# Patient Record
Sex: Female | Born: 1950
Health system: Southern US, Community
[De-identification: ages and names within clinical notes are randomized; demographics above are authoritative.]

## PROBLEM LIST (undated history)

## (undated) DIAGNOSIS — L900 Lichen sclerosus et atrophicus: Secondary | ICD-10-CM

## (undated) DIAGNOSIS — Z8719 Personal history of other diseases of the digestive system: Secondary | ICD-10-CM

## (undated) HISTORY — DX: Lichen sclerosus et atrophicus: L90.0

## (undated) HISTORY — DX: Personal history of other diseases of the digestive system: Z87.19

## (undated) HISTORY — PX: BREAST BIOPSY: SHX20

---

## 1977-10-25 HISTORY — PX: ABDOMINAL HYSTERECTOMY: SHX81

## 2004-08-03 ENCOUNTER — Ambulatory Visit: Payer: Self-pay | Admitting: Family Medicine

## 2007-10-26 HISTORY — PX: OTHER SURGICAL HISTORY: SHX169

## 2008-03-28 ENCOUNTER — Ambulatory Visit: Payer: Self-pay | Admitting: Internal Medicine

## 2008-10-03 ENCOUNTER — Inpatient Hospital Stay: Payer: Self-pay | Admitting: Internal Medicine

## 2010-02-19 ENCOUNTER — Ambulatory Visit: Payer: Self-pay | Admitting: Family Medicine

## 2010-02-23 ENCOUNTER — Emergency Department: Payer: Self-pay | Admitting: Emergency Medicine

## 2010-02-23 DIAGNOSIS — I10 Essential (primary) hypertension: Secondary | ICD-10-CM | POA: Insufficient documentation

## 2012-04-17 LAB — HM HEPATITIS C SCREENING LAB: HM Hepatitis Screen: NEGATIVE

## 2013-08-14 DIAGNOSIS — R9431 Abnormal electrocardiogram [ECG] [EKG]: Secondary | ICD-10-CM | POA: Insufficient documentation

## 2013-08-20 HISTORY — PX: OTHER SURGICAL HISTORY: SHX169

## 2013-08-31 HISTORY — PX: OTHER SURGICAL HISTORY: SHX169

## 2013-09-10 ENCOUNTER — Encounter: Payer: Self-pay | Admitting: General Surgery

## 2013-09-10 ENCOUNTER — Ambulatory Visit (INDEPENDENT_AMBULATORY_CARE_PROVIDER_SITE_OTHER): Payer: BC Managed Care – PPO | Admitting: General Surgery

## 2013-09-10 VITALS — BP 150/80 | HR 78 | Resp 12 | Ht 63.0 in | Wt 158.0 lb

## 2013-09-10 DIAGNOSIS — R920 Mammographic microcalcification found on diagnostic imaging of breast: Secondary | ICD-10-CM

## 2013-09-10 DIAGNOSIS — R921 Mammographic calcification found on diagnostic imaging of breast: Secondary | ICD-10-CM | POA: Insufficient documentation

## 2013-09-10 NOTE — Progress Notes (Signed)
Patient ID: Dana Harper, female   DOB: May 03, 1951, 62 y.o.   MRN: [id number]  Chief Complaint  Patient presents with  . Other    Abnormal mammogram    HPI Dana Harper is a 62 y.o. female who presents for a breast evaluation. The most recent mammogram was done on 08/20/13. Patient does perform regular self breast checks and gets regular mammograms done.  The patient denies any problems with her breasts at this time. She was last seen in  1999 for right breast calcifications. Nothing was done at that time. She has a sister with breast cancer which was diagnosed approximately 2 months ago.  The patient reports that she does take a baby aspirin daily, and she finds that her skin bruises with minimal trauma.  HPI  Past Medical History  Diagnosis Date  . Hypertension   . Hyperlipidemia     History reviewed. No pertinent past surgical history.  Family History  Problem Relation Age of Onset  . Cancer Father     stomach cancer  . Cancer Sister     breast cancer    Social History History  Substance Use Topics  . Smoking status: Never Smoker   . Smokeless tobacco: Never Used  . Alcohol Use: No    Allergies  Allergen Reactions  . Codeine Rash    Current Outpatient Prescriptions  Medication Sig Dispense Refill  . amLODipine-benazepril (LOTREL) 5-10 MG per capsule Take 1 capsule by mouth daily.      Marland Kitchen aspirin 81 MG tablet Take 81 mg by mouth daily.      Marland Kitchen lovastatin (MEVACOR) 20 MG tablet Take 1 tablet by mouth daily.       No current facility-administered medications for this visit.    Review of Systems Review of Systems  Constitutional: Negative.   Respiratory: Negative.   Cardiovascular: Negative.     Blood pressure 150/80, pulse 78, resp. rate 12, height 5\' 3"  (1.6 m), weight 158 lb (71.668 kg).  Physical Exam Physical Exam  Constitutional: She is oriented to person, place, and time. She appears well-developed and well-nourished.  Neck: Neck supple. No  thyromegaly present.  Cardiovascular: Normal rate, regular rhythm and normal heart sounds.   No murmur heard. Pulmonary/Chest: Effort normal and breath sounds normal. Right breast exhibits no inverted nipple, no mass, no nipple discharge, no skin change and no tenderness. Left breast exhibits no inverted nipple, no mass, no nipple discharge, no skin change and no tenderness.  Lymphadenopathy:    She has no cervical adenopathy.    She has no axillary adenopathy.  Neurological: She is alert and oriented to person, place, and time.  Skin: Skin is warm and dry.    Data Reviewed Bilateral mammograms dated August 16, 2013 showed scattered benign appearing calcifications in the right breast unchanged from previous exams. 2 groups of calcifications were appreciated in the upper outer quadrant left breast. BI-RAD-0.  A focal spot compression views dated August 20, 2013 showed a 2 cm grouping of calcifications all of which exhibited amorphous morphology. BI-RAD-4.  There are diffuse calcifications noted in both breasts, but there is a focal area of increased calcifications in the left upper outer quadrant.   Laboratory studies dated August 14, 2013 showed normal renal function with a creatinine of 0.95, normal electrolytes, minimally elevated serum cholesterol.  ECG obtained during that office visit of August 14, 2013 showed some changes. The patient subsequently underwent a stress test which are reported as normal. She is  however scheduled for cardiac catheterization later this week.  Assessment    Abnormal mammogram.  Abnormal ECG.    Plan    Left breast stereotactic biopsy to sample the area in the upper outer quadrant was recommended.   The patient was asked to contact the office if her cardiac catheterization results and being placed on any antiplatelet medications.  The patient was offered to have another physician complete her biopsy next week, but she declined.    Patient has  been scheduled for a left breast stereotactic biopsy at [facility] for 10-01-13 at 2 pm. She will check-in at the [facility] at 1:30 pm. This patient is aware of date, time, and instructions. Patient verbalizes understanding. [facility] films have been requested today and will be delivered to [facility] prior to scheduled biopsy.    Dana Harper 09/10/2013, 9:26 PM

## 2013-09-10 NOTE — Patient Instructions (Addendum)
Patient to be scheduled for a left breast stereotactic breast biopsy.   Stereotactic Breast Biopsy  A stereotactic breast biopsy is a procedure in which mammography is used in the collection of a sample of breast tissue. Mammography is a type of X-ray exam of the breasts that produces an image called a mammogram. The mammogram allows your health care provider to precisely locate the area of the breast from which a tissue sample will be taken. The tissue is then examined under a microscope to see if cancerous cells are present. A breast biopsy is done when:   A lump, abnormality, or mass is seen in the breast on a breast X-ray (mammogram).   Small calcium deposits (calcifications) are seen in the breast.   The shape or appearance of the breasts changes.   The shape or appearance of the nipples changes. You may have unusual or bloody discharge coming from the nipples, or you may have crusting, retraction, or dimpling of the nipples. A breast biopsy can indicate if you need surgery or other treatment.  LET [facility] CARE PROVIDER KNOW ABOUT:  Any allergies you have.  All medicines you are taking, including vitamins, herbs, eye drops, creams, and over-the-counter medicines.  Previous problems you or members of your family have had with the use of anesthetics.  Any blood disorders you have.  Previous surgeries you have had.  Medical conditions you have. RISKS AND COMPLICATIONS Generally, stereotactic breast biopsy is a safe procedure. However, as with any procedure, complications can occur. Possible complications include:  Infection at the needle-insertion site.   Bleeding or bruising after surgery.  The breast may become altered or deformed as a result of the procedure.  The needle may go through the chest wall into the lung area.  BEFORE THE PROCEDURE  Wear a supportive bra to the procedure.  You will be asked to remove jewelry, dentures, eyeglasses, metal objects, or  clothing that might interfere with the X-ray images. You may want to leave some of these objects at home.  Arrange for someone to drive you home after the procedure if desired. PROCEDURE  A stereotactic breast biopsy is done while you are awake. During the procedure, relax as much as possible. Let your health care provider know if you are uncomfortable, anxious, or in pain. Usually, the only discomfort felt during the procedure is caused by staying in one position for the length of the procedure. This discomfort can be reduced by carefully placed cushions. Most of the time the biopsy is done using a table with openings on it. You will be asked to lie facedown on the table and place your breasts through the openings. Your breast is compressed between metal plates to get good X-ray images. Your skin will be cleaned, and a numbing medicine (local anesthetic) will be injected. A small cut (incision) will be made in your breast. The tip of the biopsy needle will be directed through the incision. Several small pieces of suspicious tissue will be taken. Then, a final set of X-ray images will be obtained. If they show that the suspicious tissue has been mostly or completely removed, a small clip will be left at the biopsy site. This is done so that the biopsy site can be easily located if the results of the biopsy show that the tissue is cancerous.  After the procedure, the incision will be stitched (sutured) or taped and covered with a bandage (dressing). Your health care provider may apply a pressure dressing and  an ice pack to prevent bleeding and swelling in the breast.  A stereotactic breast biopsy can take 30 minutes or more. AFTER THE PROCEDURE  If you are doing well and have no problems, you will be allowed to go home.  Document Released: 07/10/2003 Document Revised: 06/13/2013 Document Reviewed: 05/10/2013 [facility] Patient Information 2014 [place], [place].  Patient has been scheduled for a left breast  stereotactic biopsy at [facility] for 10-01-13 at 2 pm. She will check-in at the [facility] at 1:30 pm. This patient is aware of date, time, and instructions. Patient verbalizes understanding.

## 2013-09-12 ENCOUNTER — Ambulatory Visit: Payer: Self-pay | Admitting: Cardiology

## 2013-10-01 ENCOUNTER — Ambulatory Visit: Payer: Self-pay | Admitting: General Surgery

## 2013-10-01 DIAGNOSIS — R920 Mammographic microcalcification found on diagnostic imaging of breast: Secondary | ICD-10-CM

## 2013-10-02 ENCOUNTER — Telehealth: Payer: Self-pay | Admitting: *Deleted

## 2013-10-02 LAB — PATHOLOGY REPORT

## 2013-10-02 NOTE — Telephone Encounter (Signed)
Notified patient as instructed, per Dr. Lemar Livings "negative", patient pleased. Discussed follow-up appointments, patient agrees

## 2013-10-04 ENCOUNTER — Encounter: Payer: Self-pay | Admitting: General Surgery

## 2013-10-08 ENCOUNTER — Ambulatory Visit (INDEPENDENT_AMBULATORY_CARE_PROVIDER_SITE_OTHER): Payer: BC Managed Care – PPO | Admitting: *Deleted

## 2013-10-08 DIAGNOSIS — R928 Other abnormal and inconclusive findings on diagnostic imaging of breast: Secondary | ICD-10-CM

## 2013-10-08 DIAGNOSIS — R921 Mammographic calcification found on diagnostic imaging of breast: Secondary | ICD-10-CM

## 2013-10-08 NOTE — Progress Notes (Signed)
Patient here today for follow up post left breast biopsy. Steristrip in place and aware it may come off in one week.  Minimal bruising noted.  The patient is aware that a heating pad may be used for comfort as needed.  Aware of pathology.

## 2013-12-11 ENCOUNTER — Other Ambulatory Visit: Payer: Self-pay | Admitting: Family Medicine

## 2013-12-11 LAB — CBC WITH DIFFERENTIAL/PLATELET
BASOS ABS: 0.1 10*3/uL (ref 0.0–0.1)
Basophil %: 0.4 %
Eosinophil #: 0 10*3/uL (ref 0.0–0.7)
Eosinophil %: 0 %
HCT: 46.4 % (ref 35.0–47.0)
HGB: 15.8 g/dL (ref 12.0–16.0)
LYMPHS PCT: 1.9 %
Lymphocyte #: 0.4 10*3/uL — ABNORMAL LOW (ref 1.0–3.6)
MCH: 31.3 pg (ref 26.0–34.0)
MCHC: 34.1 g/dL (ref 32.0–36.0)
MCV: 92 fL (ref 80–100)
MONO ABS: 0.6 x10 3/mm (ref 0.2–0.9)
Monocyte %: 3.1 %
NEUTROS ABS: 17.7 10*3/uL — AB (ref 1.4–6.5)
Neutrophil %: 94.6 %
PLATELETS: 202 10*3/uL (ref 150–440)
RBC: 5.04 10*6/uL (ref 3.80–5.20)
RDW: 13.9 % (ref 11.5–14.5)
WBC: 18.7 10*3/uL — ABNORMAL HIGH (ref 3.6–11.0)

## 2013-12-11 LAB — COMPREHENSIVE METABOLIC PANEL
Albumin: 4.2 g/dL (ref 3.4–5.0)
Alkaline Phosphatase: 79 U/L
Anion Gap: 6 — ABNORMAL LOW (ref 7–16)
BUN: 16 mg/dL (ref 7–18)
Bilirubin,Total: 1.8 mg/dL — ABNORMAL HIGH (ref 0.2–1.0)
CALCIUM: 9.3 mg/dL (ref 8.5–10.1)
Chloride: 107 mmol/L (ref 98–107)
Co2: 27 mmol/L (ref 21–32)
Creatinine: 0.9 mg/dL (ref 0.60–1.30)
EGFR (African American): >60
EGFR (Non-African Amer.): >60
GLUCOSE: 126 mg/dL — AB (ref 65–99)
OSMOLALITY: 282 (ref 275–301)
POTASSIUM: 3.9 mmol/L (ref 3.5–5.1)
SGOT(AST): 23 U/L (ref 15–37)
SGPT (ALT): 29 U/L (ref 12–78)
SODIUM: 140 mmol/L (ref 136–145)
TOTAL PROTEIN: 7.8 g/dL (ref 6.4–8.2)

## 2013-12-11 LAB — AMYLASE: AMYLASE: 58 U/L (ref 25–115)

## 2014-05-20 ENCOUNTER — Ambulatory Visit: Payer: Self-pay | Admitting: Family Medicine

## 2014-05-20 LAB — CBC AND DIFFERENTIAL
HCT: 43 % (ref 36–46)
Hemoglobin: 14.3 g/dL (ref 12.0–16.0)
Platelets: 218 10*3/uL (ref 150–399)
WBC: 5.6 10^3/mL

## 2014-08-26 ENCOUNTER — Encounter: Payer: Self-pay | Admitting: General Surgery

## 2014-09-23 ENCOUNTER — Ambulatory Visit: Payer: Self-pay | Admitting: Family Medicine

## 2015-10-01 ENCOUNTER — Other Ambulatory Visit: Payer: Self-pay | Admitting: Family Medicine

## 2015-10-01 NOTE — Telephone Encounter (Signed)
Last ov was 02/10/15. Next ov is 10/30/15.

## 2015-10-01 NOTE — Telephone Encounter (Signed)
Pt contacted office for refill request on the following medications: lovastatin (MEVACOR) 20 MG tablet pt would like enough sent to [facility] in [place] to last until her CPE on 11/09/15. Thanks TNP

## 2015-10-02 MED ORDER — LOVASTATIN 20 MG PO TABS: 20.0000 mg | ORAL_TABLET | Freq: Every day | ORAL | Status: DC

## 2015-10-13 ENCOUNTER — Other Ambulatory Visit: Payer: Self-pay | Admitting: Family Medicine

## 2015-10-29 DIAGNOSIS — R059 Cough, unspecified: Secondary | ICD-10-CM | POA: Insufficient documentation

## 2015-10-29 DIAGNOSIS — L900 Lichen sclerosus et atrophicus: Secondary | ICD-10-CM | POA: Insufficient documentation

## 2015-10-29 DIAGNOSIS — R112 Nausea with vomiting, unspecified: Secondary | ICD-10-CM | POA: Insufficient documentation

## 2015-10-29 DIAGNOSIS — K859 Acute pancreatitis without necrosis or infection, unspecified: Secondary | ICD-10-CM | POA: Insufficient documentation

## 2015-10-29 DIAGNOSIS — M89319 Hypertrophy of bone, unspecified shoulder: Secondary | ICD-10-CM | POA: Insufficient documentation

## 2015-10-29 DIAGNOSIS — R109 Unspecified abdominal pain: Secondary | ICD-10-CM | POA: Insufficient documentation

## 2015-10-29 DIAGNOSIS — R231 Pallor: Secondary | ICD-10-CM | POA: Insufficient documentation

## 2015-10-29 DIAGNOSIS — R05 Cough: Secondary | ICD-10-CM | POA: Insufficient documentation

## 2015-10-29 DIAGNOSIS — Z8619 Personal history of other infectious and parasitic diseases: Secondary | ICD-10-CM | POA: Insufficient documentation

## 2015-10-30 ENCOUNTER — Encounter: Payer: Self-pay | Admitting: Family Medicine

## 2015-10-30 ENCOUNTER — Ambulatory Visit (INDEPENDENT_AMBULATORY_CARE_PROVIDER_SITE_OTHER): Payer: BC Managed Care – PPO | Admitting: Family Medicine

## 2015-10-30 VITALS — BP 150/84 | HR 81 | Temp 98.2°F | Resp 16 | Ht 62.25 in | Wt 167.0 lb

## 2015-10-30 DIAGNOSIS — Z1239 Encounter for other screening for malignant neoplasm of breast: Secondary | ICD-10-CM | POA: Diagnosis not present

## 2015-10-30 DIAGNOSIS — Z1211 Encounter for screening for malignant neoplasm of colon: Secondary | ICD-10-CM

## 2015-10-30 DIAGNOSIS — Z0000 Encounter for general adult medical examination without abnormal findings: Secondary | ICD-10-CM

## 2015-10-30 DIAGNOSIS — Z23 Encounter for immunization: Secondary | ICD-10-CM | POA: Diagnosis not present

## 2015-10-30 DIAGNOSIS — E7800 Pure hypercholesterolemia, unspecified: Secondary | ICD-10-CM | POA: Diagnosis not present

## 2015-10-30 DIAGNOSIS — I10 Essential (primary) hypertension: Secondary | ICD-10-CM

## 2015-10-30 MED ORDER — LOVASTATIN 20 MG PO TABS: 20.0000 mg | ORAL_TABLET | Freq: Every day | ORAL | Status: DC

## 2015-10-30 MED ORDER — AMLODIPINE BESY-BENAZEPRIL HCL 5-10 MG PO CAPS: 1.0000 | ORAL_CAPSULE | Freq: Every day | ORAL | Status: DC

## 2015-10-30 NOTE — Progress Notes (Signed)
Patient: Dana Harper, Female    DOB: [birth year], 65 y.o.   MRN: [id number] Visit Date: 10/30/2015  Today's Provider: Lelon Huh, MD   Chief Complaint  Patient presents with  . Annual Exam  . Hypertension    follow up  . Hyperlipidemia    follow up   Subjective:    Annual physical exam Dana Harper is a 65 y.o. female who presents today for health maintenance and complete physical. She feels fairly well. She reports exercising never. She reports she is sleeping fairly well.  -----------------------------------------------------------------  Hypertension, follow-up:  BP Readings from Last 3 Encounters:  02/10/15 114/80  09/10/13 150/80    She was last seen for hypertension 2 years ago.  BP at that visit was 136/84. Management since that visit includes no changes. She reports good compliance with treatment. She is not having side effects.  She is not exercising. She is adherent to low salt diet.   Outside blood pressures are 130's/80's. She is experiencing none.  Patient denies none.   Cardiovascular risk factors include dyslipidemia and hypertension.  Use of agents associated with hypertension: NSAIDS.     Weight trend: stable Wt Readings from Last 3 Encounters:  02/10/15 162 lb (73.483 kg)  09/10/13 158 lb (71.668 kg)    Current diet: in general, an "unhealthy" diet  ------------------------------------------------------------------------   Lipid/Cholesterol, Follow-up:   Last seen for this2 years ago.  Management changes since that visit include no changes. . Last Lipid Panel: No results found for: CHOL, TRIG, HDL, CHOLHDL, VLDL, LDLCALC, LDLDIRECT  Risk factors for vascular disease include hypertension  She reports good compliance with treatment. She is not having side effects.  Current symptoms include none and have been stable. Weight trend: stable Prior visit with dietician: no Current diet: in general, an "unhealthy"  diet Current exercise: none  Wt Readings from Last 3 Encounters:  02/10/15 162 lb (73.483 kg)  09/10/13 158 lb (71.668 kg)    -------------------------------------------------------------------   Review of Systems  Constitutional: Negative for fever, chills and fatigue.  HENT: Negative for congestion, ear pain, rhinorrhea, sneezing and sore throat.   Eyes: Negative.  Negative for pain and redness.  Respiratory: Negative for cough, shortness of breath and wheezing.   Cardiovascular: Negative for chest pain and leg swelling.  Gastrointestinal: Negative for nausea, abdominal pain, diarrhea, constipation and blood in stool.  Endocrine: Negative for polydipsia and polyphagia.  Genitourinary: Negative.  Negative for dysuria, hematuria, flank pain, vaginal bleeding, vaginal discharge and pelvic pain.  Musculoskeletal: Negative for back pain, joint swelling, arthralgias and gait problem.  Skin: Negative for rash.  Neurological: Negative.  Negative for dizziness, tremors, seizures, weakness, light-headedness, numbness and headaches.  Hematological: Negative for adenopathy. Bruises/bleeds easily.  Psychiatric/Behavioral: Negative.  Negative for behavioral problems, confusion and dysphoric mood. The patient is not nervous/anxious and is not hyperactive.     Social History      She  reports that she has never smoked. She has never used smokeless tobacco. She reports that she does not drink alcohol or use illicit drugs.       Social History   Social History  . Marital Status: Married    Spouse Name: N/A  . Number of Children: 2  . Years of Education: N/A   Occupational History  . Works for [employer]    Social History Main Topics  . Smoking status: Never Smoker   . Smokeless tobacco: Never Used  .  Alcohol Use: No  . Drug Use: No  . Sexual Activity: Not on file   Other Topics Concern  . Not on file   Social History Narrative    Past Medical History   Diagnosis Date  . Hypertension   . Hyperlipidemia   . History of pancreatitis   . Lichen sclerosus      Patient Active Problem List   Diagnosis Date Noted  . Abdominal pain 10/29/2015  . Clavicle enlargement 10/29/2015  . Cough 10/29/2015  . History of shingles 10/29/2015  . Idiopathic or primary livedo reticularis 10/29/2015  . Lichen sclerosus [id number]  . N&V (nausea and vomiting) 10/29/2015  . Pancreatitis 10/29/2015  . Mammographic calcification 09/10/2013  . Abnormal ECG 08/14/2013  . Benign essential HTN 02/23/2010  . Hypercholesteremia 05/31/2008  . Family history of cancer of digestive system 05/29/2008    Past Surgical History  Procedure Laterality Date  . Abdominal hysterectomy  1979    vaginal: partial  hysterectomy. No history of cancerous or precancerous conditions  . Stress echocardiogram  08/31/2013    changes consistent with apical ischemia  . Mammogram screening  08/20/2013    Bi rads cat 4  . Pap smear  2009    Dr. Bayard Males -[facility]    Family History        Family Status  Relation Status Death Age  . Father Deceased 46  . Sister Alive   . Mother Deceased 24    Sepsis        Her family history includes Cancer in her father and sister.    Allergies  Allergen Reactions  . Demerol  [Meperidine]   . Codeine Rash    Previous Medications   AMLODIPINE-BENAZEPRIL (LOTREL) 5-10 MG PER CAPSULE    Take 1 capsule by mouth daily.   ASPIRIN 81 MG TABLET    Take 81 mg by mouth daily.    LOVASTATIN (MEVACOR) 20 MG TABLET    TAKE 1 TABLET BY MOUTH AT BEDTIME    Patient Care Team: Birdie Sons, MD as PCP - General (Family Medicine) Robert Bellow, MD (General Surgery) Birdie Sons, MD as Referring Physician (Family Medicine)     Objective:   Vitals: BP 150/84 mmHg  Pulse 81  Temp(Src) 98.2 F (36.8 C) (Oral)  Resp 16  Ht 5' 2.25" (1.581 m)  Wt 167 lb (75.751 kg)  BMI 30.31 kg/m2  SpO2 98%   Physical Exam  General Appearance:     Alert, cooperative, no distress, appears stated age  Head:    Normocephalic, without obvious abnormality, atraumatic  Eyes:    PERRL, conjunctiva/corneas clear, EOM's intact, fundi    benign, both eyes  Ears:    Normal TM's and external ear canals, both ears  Nose:   Nares normal, septum midline, mucosa normal, no drainage    or sinus tenderness  Throat:   Lips, mucosa, and tongue normal; teeth and gums normal  Neck:   Supple, symmetrical, trachea midline, no adenopathy;    thyroid:  no enlargement/tenderness/nodules; no carotid   bruit or JVD  Back:     Symmetric, no curvature, ROM normal, no CVA tenderness  Lungs:     Clear to auscultation bilaterally, respirations unlabored  Chest Wall:    No tenderness or deformity   Heart:    Regular rate and rhythm, S1 and S2 normal, no murmur, rub   or gallop  Breast Exam:    normal appearance, no masses or tenderness  Abdomen:  Soft, non-tender, bowel sounds active all four quadrants,    no masses, no organomegaly  Pelvic:    deferred  Extremities:   Extremities normal, atraumatic, no cyanosis or edema  Pulses:   2+ and symmetric all extremities  Skin:   Skin color, texture, turgor normal, no rashes or lesions  Lymph nodes:   Cervical, supraclavicular, and axillary nodes normal  Neurologic:   CNII-XII intact, normal strength, sensation and reflexes    throughout    Depression Screen PHQ 2/9 Scores 10/30/2015  PHQ - 2 Score 0  PHQ- 9 Score 0      Assessment & Plan:     Routine Health Maintenance and Physical Exam  Exercise Activities and Dietary recommendations Goals    None      Immunization History  Administered Date(s) Administered  . Tdap 05/29/2008  . Zoster 07/10/2014    Health Maintenance  Topic Date Due  . Hepatitis C Screening  07-Apr-1951  . HIV Screening  06/08/1966  . PAP SMEAR  06/08/1972  . COLONOSCOPY  06/08/2001  . INFLUENZA VACCINE  05/26/2015  . MAMMOGRAM  08/17/2015  . TETANUS/TDAP  05/29/2018  .  ZOSTAVAX  Completed      Discussed health benefits of physical activity, and encouraged her to engage in regular exercise appropriate for her age and condition.    --------------------------------------------------------------------  1. Annual physical exam   2. Hypercholesteremia She is tolerating lovastatin well with no adverse effects.   - Lipid panel  3. Benign essential HTN Well controlled.  Continue current medications.   - EKG 12-Lead - Renal function panel  4. Screening for colon cancer  - Ambulatory referral to Gastroenterology  5. Screening for breast cancer  - MM DIGITAL SCREENING BILATERAL; Future  6. Need for influenza vaccination  - Flu Vaccine QUAD 36+ mos IM

## 2015-10-31 ENCOUNTER — Other Ambulatory Visit: Payer: Self-pay

## 2015-10-31 ENCOUNTER — Telehealth: Payer: Self-pay

## 2015-10-31 LAB — LIPID PANEL
CHOL/HDL RATIO: 3.8 ratio (ref 0.0–4.4)
CHOLESTEROL TOTAL: 214 mg/dL — AB (ref 100–199)
HDL: 57 mg/dL (ref >39)
LDL CALC: 135 mg/dL — AB (ref 0–99)
Triglycerides: 111 mg/dL (ref 0–149)
VLDL CHOLESTEROL CAL: 22 mg/dL (ref 5–40)

## 2015-10-31 LAB — RENAL FUNCTION PANEL
ALBUMIN: 4.5 g/dL (ref 3.6–4.8)
BUN / CREAT RATIO: 17 (ref 11–26)
BUN: 16 mg/dL (ref 8–27)
CO2: 21 mmol/L (ref 18–29)
CREATININE: 0.94 mg/dL (ref 0.57–1.00)
Calcium: 9.7 mg/dL (ref 8.7–10.3)
Chloride: 102 mmol/L (ref 96–106)
GFR, EST AFRICAN AMERICAN: 74 mL/min/{1.73_m2} (ref >59)
GFR, EST NON AFRICAN AMERICAN: 64 mL/min/{1.73_m2} (ref >59)
Glucose: 109 mg/dL — ABNORMAL HIGH (ref 65–99)
Phosphorus: 3.1 mg/dL (ref 2.5–4.5)
Potassium: 4.3 mmol/L (ref 3.5–5.2)
Sodium: 140 mmol/L (ref 134–144)

## 2015-10-31 NOTE — Telephone Encounter (Signed)
Gastroenterology Pre-Procedure Review  Request Date: 12/02/15 Requesting Physician: Dr. Caryn Section  PATIENT REVIEW QUESTIONS: The patient responded to the following health history questions as indicated:    1. Are you having any GI issues? no 2. Do you have a personal history of Polyps? No 3. Do you have a family history of Colon Cancer or Polyps? Yes, Sister colon cancer 4. Diabetes Mellitus? no 5. Joint replacements in the past 12 months?no 6. Major health problems in the past 3 months?no 7. Any artificial heart valves, MVP, or defibrillator?no    MEDICATIONS & ALLERGIES:    Patient reports the following regarding taking any anticoagulation/antiplatelet therapy:   Plavix, Coumadin, Eliquis, Xarelto, Lovenox, Pradaxa, Brilinta, or Effient? no Aspirin? yes (ASA 81mg )  Patient confirms/reports the following medications:  Current Outpatient Prescriptions  Medication Sig Dispense Refill  . amLODipine-benazepril (LOTREL) 5-10 MG capsule Take 1 capsule by mouth daily. 90 capsule 4  . aspirin 81 MG tablet Take 81 mg by mouth daily.     Marland Kitchen lovastatin (MEVACOR) 20 MG tablet Take 1 tablet (20 mg total) by mouth at bedtime. 30 tablet 12   No current facility-administered medications for this visit.    Patient confirms/reports the following allergies:  Allergies  Allergen Reactions  . Demerol  [Meperidine]   . Codeine Rash    No orders of the defined types were placed in this encounter.    AUTHORIZATION INFORMATION Primary Insurance: 1D#: Group #:  Secondary Insurance: 1D#: Group #:  SCHEDULE INFORMATION: Date: 12/02/15 Time: Location: [place]

## 2015-11-06 LAB — HM MAMMOGRAPHY: HM Mammogram: NORMAL

## 2015-11-07 ENCOUNTER — Encounter: Payer: Self-pay | Admitting: *Deleted

## 2015-12-01 ENCOUNTER — Encounter: Payer: Self-pay | Admitting: *Deleted

## 2015-12-02 ENCOUNTER — Ambulatory Visit
Admission: RE | Admit: 2015-12-02 | Discharge: 2015-12-02 | Disposition: A | Discharge status: Home / Self Care | Payer: BC Managed Care – PPO | Source: Ambulatory Visit | Attending: Gastroenterology | Admitting: Gastroenterology

## 2015-12-02 ENCOUNTER — Ambulatory Visit: Payer: BC Managed Care – PPO | Admitting: Certified Registered"

## 2015-12-02 ENCOUNTER — Encounter
Admission: RE | Disposition: A | Discharge status: Home / Self Care | Payer: Self-pay | Source: Ambulatory Visit | Attending: Gastroenterology

## 2015-12-02 ENCOUNTER — Encounter: Payer: Self-pay | Admitting: *Deleted

## 2015-12-02 DIAGNOSIS — L900 Lichen sclerosus et atrophicus: Secondary | ICD-10-CM | POA: Insufficient documentation

## 2015-12-02 DIAGNOSIS — Z90710 Acquired absence of both cervix and uterus: Secondary | ICD-10-CM | POA: Diagnosis not present

## 2015-12-02 DIAGNOSIS — I10 Essential (primary) hypertension: Secondary | ICD-10-CM | POA: Insufficient documentation

## 2015-12-02 DIAGNOSIS — Z1211 Encounter for screening for malignant neoplasm of colon: Secondary | ICD-10-CM | POA: Diagnosis not present

## 2015-12-02 DIAGNOSIS — Z7982 Long term (current) use of aspirin: Secondary | ICD-10-CM | POA: Diagnosis not present

## 2015-12-02 DIAGNOSIS — D125 Benign neoplasm of sigmoid colon: Secondary | ICD-10-CM | POA: Insufficient documentation

## 2015-12-02 DIAGNOSIS — Z8719 Personal history of other diseases of the digestive system: Secondary | ICD-10-CM | POA: Insufficient documentation

## 2015-12-02 DIAGNOSIS — Z885 Allergy status to narcotic agent status: Secondary | ICD-10-CM | POA: Insufficient documentation

## 2015-12-02 DIAGNOSIS — Z79899 Other long term (current) drug therapy: Secondary | ICD-10-CM | POA: Insufficient documentation

## 2015-12-02 DIAGNOSIS — K641 Second degree hemorrhoids: Secondary | ICD-10-CM | POA: Diagnosis not present

## 2015-12-02 DIAGNOSIS — Z86010 Personal history of colonic polyps: Secondary | ICD-10-CM | POA: Insufficient documentation

## 2015-12-02 DIAGNOSIS — Z803 Family history of malignant neoplasm of breast: Secondary | ICD-10-CM | POA: Diagnosis not present

## 2015-12-02 DIAGNOSIS — Z860101 Personal history of adenomatous and serrated colon polyps: Secondary | ICD-10-CM | POA: Insufficient documentation

## 2015-12-02 DIAGNOSIS — Z800 Family history of malignant neoplasm of digestive organs: Secondary | ICD-10-CM | POA: Insufficient documentation

## 2015-12-02 DIAGNOSIS — K5730 Diverticulosis of large intestine without perforation or abscess without bleeding: Secondary | ICD-10-CM | POA: Insufficient documentation

## 2015-12-02 HISTORY — PX: COLONOSCOPY WITH PROPOFOL: SHX5780

## 2015-12-02 SURGERY — COLONOSCOPY WITH PROPOFOL
Anesthesia: General

## 2015-12-02 MED ORDER — PROPOFOL 10 MG/ML IV BOLUS
INTRAVENOUS | Status: DC | PRN
  Administered 2015-12-02: 70 mg via INTRAVENOUS
  Administered 2015-12-02: 30 mg via INTRAVENOUS

## 2015-12-02 MED ORDER — SODIUM CHLORIDE 0.9 % IV SOLN
INTRAVENOUS | Status: DC
  Administered 2015-12-02: 08:00:00 via INTRAVENOUS

## 2015-12-02 MED ORDER — SODIUM CHLORIDE 0.9 % IV SOLN
INTRAVENOUS | Status: DC
  Administered 2015-12-02: 07:00:00 via INTRAVENOUS

## 2015-12-02 MED ORDER — PROPOFOL 500 MG/50ML IV EMUL
INTRAVENOUS | Status: DC | PRN
  Administered 2015-12-02: 120 ug/kg/min via INTRAVENOUS

## 2015-12-02 MED ORDER — LIDOCAINE HCL (CARDIAC) 20 MG/ML IV SOLN
INTRAVENOUS | Status: DC | PRN
  Administered 2015-12-02: 50 mg via INTRAVENOUS

## 2015-12-02 NOTE — Op Note (Signed)
[facility]astroenterology Patient Name: Dana Harper Procedure Date: 12/02/2015 7:45 AM MRN: [id number] Account #: [id number] Date of Birth: 01/17/1951 Admit Type: Outpatient Age: 65 Room: [facility] ENDO ROOM 4 Gender: Female Note Status: Finalized Procedure:         Colonoscopy Indications:       Screening for colorectal malignant neoplasm Providers:         Lucilla Lame, MD Referring MD:      Kirstie Peri. Caryn Section, MD (Referring MD) Medicines:         Propofol per Anesthesia Complications:     No immediate complications. Procedure:         Pre-Anesthesia Assessment:                    - Prior to the procedure, a History and Physical was                     performed, and patient medications and allergies were                     reviewed. The patient's tolerance of previous anesthesia                     was also reviewed. The risks and benefits of the procedure                     and the sedation options and risks were discussed with the                     patient. All questions were answered, and informed consent                     was obtained. Prior Anticoagulants: The patient has taken                     no previous anticoagulant or antiplatelet agents. ASA                     Grade Assessment: II - A patient with mild systemic                     disease. After reviewing the risks and benefits, the                     patient was deemed in satisfactory condition to undergo                     the procedure.                    After obtaining informed consent, the colonoscope was                     passed under direct vision. Throughout the procedure, the                     patient's blood pressure, pulse, and oxygen saturations                     were monitored continuously. The Colonoscope was                     introduced through the anus and advanced to the the cecum,  identified by appendiceal orifice and ileocecal valve. The                colonoscopy was performed without difficulty. The patient                     tolerated the procedure well. The quality of the bowel                     preparation was excellent. Findings:      The perianal and digital rectal examinations were normal.      A 7 mm polyp was found in the sigmoid colon. The polyp was pedunculated.       The polyp was removed with a cold snare. Resection and retrieval were       complete.      Multiple small-mouthed diverticula were found in the sigmoid colon.      Non-bleeding internal hemorrhoids were found during retroflexion. The       hemorrhoids were Grade II (internal hemorrhoids that prolapse but reduce       spontaneously). Impression:        - One 7 mm polyp in the sigmoid colon. Resected and                     retrieved.                    - Diverticulosis in the sigmoid colon.                    - Non-bleeding internal hemorrhoids. Recommendation:    - Await pathology results.                    - Repeat colonoscopy in 5 years if polyp adenoma and 10                     years if hyperplastic Procedure Code(s): --- Professional ---                    [phone], Colonoscopy, flexible; with removal of tumor(s),                     polyp(s), or other lesion(s) by snare technique Diagnosis Code(s): --- Professional ---                    Z12.11, Encounter for screening for malignant neoplasm of                     colon                    D12.5, Benign neoplasm of sigmoid colon CPT copyright 2014 American Medical Association. All rights reserved. The codes documented in this report are preliminary and upon coder review may  be revised to meet current compliance requirements. Lucilla Lame, MD 12/02/2015 8:02:07 AM This report has been signed electronically. Number of Addenda: 0 Note Initiated On: 12/02/2015 7:45 AM Scope Withdrawal Time: 0 hours 7 minutes 30 seconds  Total Procedure Duration: 0 hours 9 minutes 47 seconds       [facility]

## 2015-12-02 NOTE — H&P (Signed)
  [place]  [address], [place], [place] [address] Phone: [phone] Fax : [phone]  Primary Care Physician:  Lelon Huh, MD Primary Gastroenterologist:  Dr. Allen Norris  Pre-Procedure History & Physical: HPI:  Dana Harper is a 65 y.o. female is here for a screening colonoscopy.   Past Medical History  Diagnosis Date  . History of pancreatitis   . Lichen sclerosus   . Hypertension     Past Surgical History  Procedure Laterality Date  . Abdominal hysterectomy  1979    vaginal: partial  hysterectomy. No history of cancerous or precancerous conditions  . Stress echocardiogram  08/31/2013    changes consistent with apical ischemia  . Mammogram screening  08/20/2013    Bi rads cat 4  . Pap smear  2009    Dr. Bayard Males [facility]    Prior to Admission medications   Medication Sig Start Date End Date Taking? Authorizing Provider  aspirin 81 MG tablet Take 81 mg by mouth daily.    Yes Historical Provider, MD  lovastatin (MEVACOR) 20 MG tablet Take 1 tablet (20 mg total) by mouth at bedtime. 10/30/15  Yes Birdie Sons, MD  amLODipine-benazepril (LOTREL) 5-10 MG capsule Take 1 capsule by mouth daily. 10/30/15   Birdie Sons, MD    Allergies as of 10/31/2015 - Review Complete 10/30/2015  Allergen Reaction Noted  . Demerol  [meperidine]  10/29/2015  . Codeine Rash 09/10/2013    Family History  Problem Relation Age of Onset  . Cancer Father     stomach cancer  . Cancer Sister     breast cancer  . Colon cancer Sister     Social History   Social History  . Marital Status: Married    Spouse Name: N/A  . Number of Children: 2  . Years of Education: N/A   Occupational History  . Retired    Social History Main Topics  . Smoking status: Never Smoker   . Smokeless tobacco: Never Used  . Alcohol Use: No  . Drug Use: No  . Sexual Activity: Not on file   Other Topics Concern  . Not on file   Social History Narrative    Review of  Systems: See HPI, otherwise negative ROS  Physical Exam: BP 156/82 mmHg  Pulse 102  Temp(Src) 97.6 F (36.4 C) (Tympanic)  Resp 18  Ht 5\' 2"  (1.575 m)  Wt 167 lb (75.751 kg)  BMI 30.54 kg/m2  SpO2 100% General:   Alert,  pleasant and cooperative in NAD Head:  Normocephalic and atraumatic. Neck:  Supple; no masses or thyromegaly. Lungs:  Clear throughout to auscultation.    Heart:  Regular rate and rhythm. Abdomen:  Soft, nontender and nondistended. Normal bowel sounds, without guarding, and without rebound.   Neurologic:  Alert and  oriented x4;  grossly normal neurologically.  Impression/Plan: [place] is now here to undergo a screening colonoscopy.  Risks, benefits, and alternatives regarding colonoscopy have been reviewed with the patient.  Questions have been answered.  All parties agreeable.

## 2015-12-02 NOTE — Anesthesia Preprocedure Evaluation (Signed)
Anesthesia Evaluation  Patient identified by MRN, date of birth, ID band Patient awake    Reviewed: Allergy & Precautions, NPO status , Patient's Chart, lab work & pertinent test results  Airway Mallampati: II       Dental  (+) Teeth Intact   Pulmonary neg pulmonary ROS,    breath sounds clear to auscultation       Cardiovascular Exercise Tolerance: Good hypertension, Pt. on medications  Rhythm:Regular     Neuro/Psych    GI/Hepatic negative GI ROS, Neg liver ROS,   Endo/Other  negative endocrine ROS  Renal/GU negative Renal ROS     Musculoskeletal negative musculoskeletal ROS (+)   Abdominal Normal abdominal exam  (+)   Peds  Hematology negative hematology ROS (+)   Anesthesia Other Findings   Reproductive/Obstetrics                             Anesthesia Physical Anesthesia Plan  ASA: II  Anesthesia Plan: General   Post-op Pain Management:    Induction: Intravenous  Airway Management Planned: Natural Airway and Nasal Cannula  Additional Equipment:   Intra-op Plan:   Post-operative Plan:   Informed Consent: I have reviewed the patients History and Physical, chart, labs and discussed the procedure including the risks, benefits and alternatives for the proposed anesthesia with the patient or authorized representative who has indicated his/her understanding and acceptance.     Plan Discussed with: CRNA  Anesthesia Plan Comments:         Anesthesia Quick Evaluation

## 2015-12-02 NOTE — Transfer of Care (Signed)
Immediate Anesthesia Transfer of Care Note  Patient: [place] Harper  Procedure(s) Performed: Procedure(s): COLONOSCOPY WITH PROPOFOL (N/A)  Patient Location: Endoscopy Unit  Anesthesia Type:General  Level of Consciousness: awake  Airway & Oxygen Therapy: Patient Spontanous Breathing and Patient connected to nasal cannula oxygen  Post-op Assessment: Report given to RN  Post vital signs: Reviewed  Last Vitals:  Filed Vitals:   12/02/15 0704 12/02/15 0806  BP: 156/82 116/67  Pulse: 102 83  Temp: 36.4 C 36.4 C  Resp: 18 16    Complications: No apparent anesthesia complications

## 2015-12-02 NOTE — Anesthesia Postprocedure Evaluation (Signed)
Anesthesia Post Note  Patient: Dana Harper  Procedure(s) Performed: Procedure(s) (LRB): COLONOSCOPY WITH PROPOFOL (N/A)  Patient location during evaluation: PACU Anesthesia Type: General Level of consciousness: awake and alert Pain management: satisfactory to patient Vital Signs Assessment: post-procedure vital signs reviewed and stable Respiratory status: spontaneous breathing Cardiovascular status: stable Anesthetic complications: no    Last Vitals:  Filed Vitals:   12/02/15 0805 12/02/15 0806  BP:  116/67  Pulse:  83  Temp: 36.4 C 36.4 C  Resp:  16    Last Pain: There were no vitals filed for this visit.               VAN STAVEREN,Kanijah Groseclose

## 2015-12-03 LAB — SURGICAL PATHOLOGY

## 2015-12-04 ENCOUNTER — Encounter: Payer: Self-pay | Admitting: Gastroenterology

## 2016-04-02 IMAGING — CR DG CHEST 2V
1 series · 2 of 2 positions shown · non-contrast
Comparison: 02/23/2010

CLINICAL DATA: Nonproductive cough for 1 week. Crackles on physical
exam.

EXAM:
CHEST  2 VIEW

[Series 1: kdxr chest pa (or ap) and lat · 0.14mm/px · 2 of 2 slices shown]
[im 1/2]
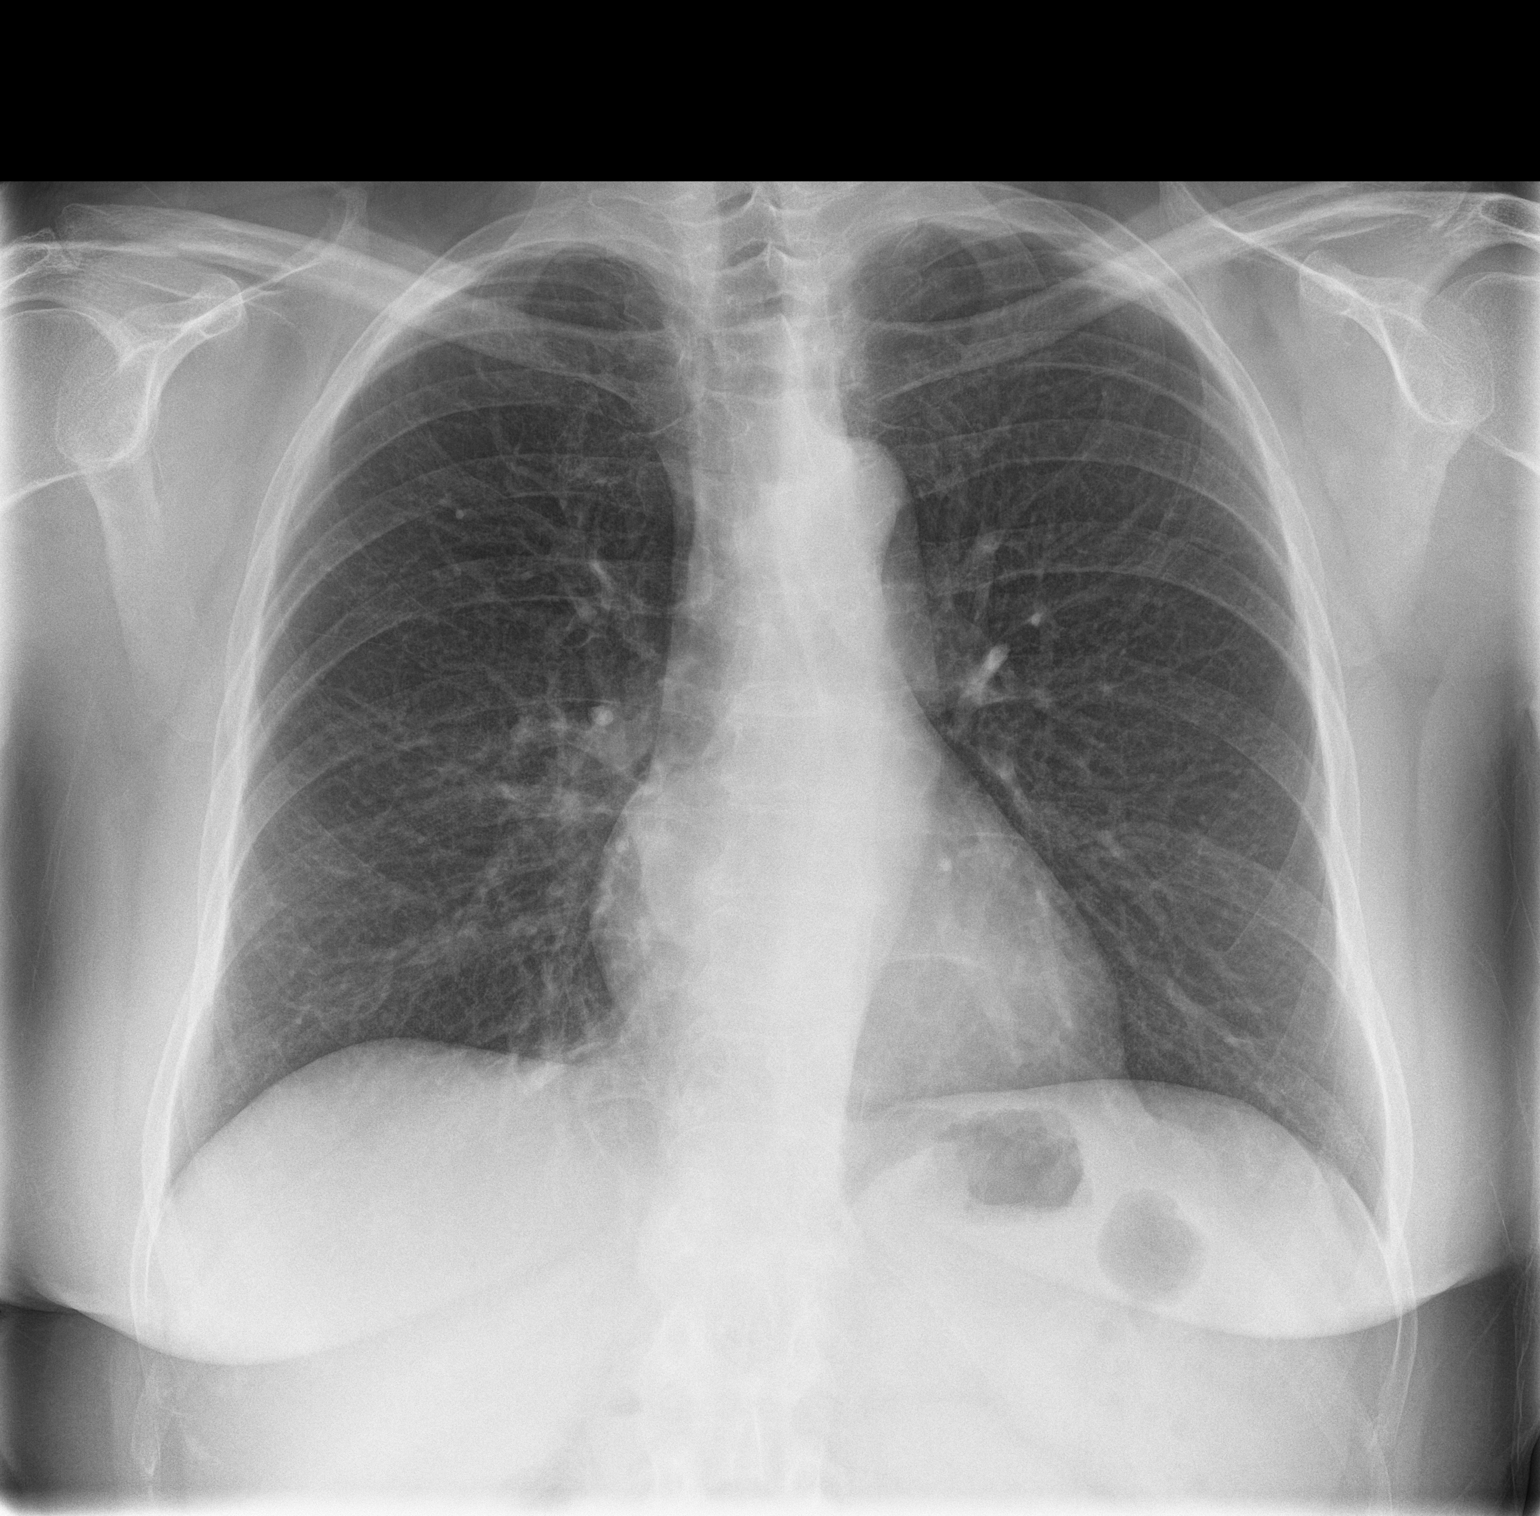
[im 2/2]
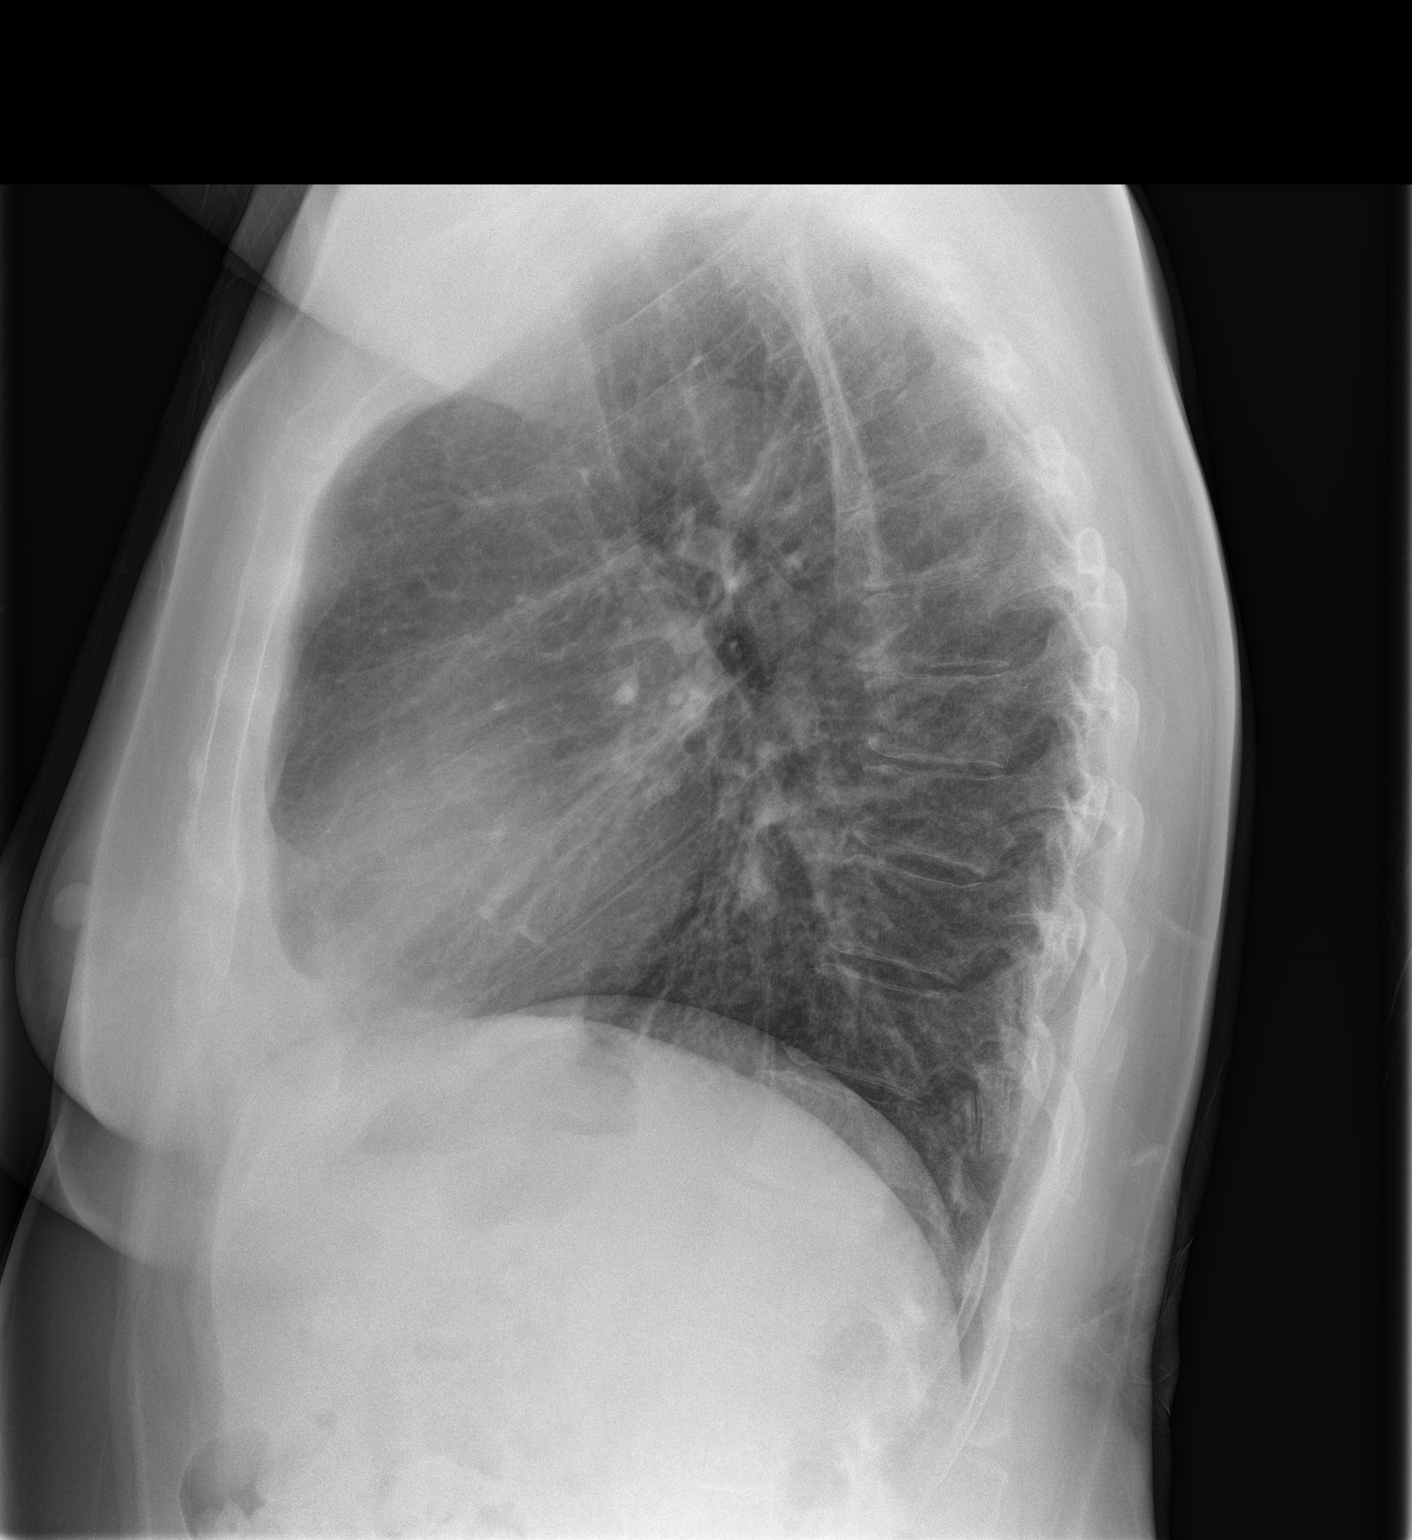

[2 of 2 positions shown; findings below may reference images not displayed]

FINDINGS: Mild hyperinflation. Midline trachea. Normal heart size. Mildly
tortuous thoracic aorta. Mediastinal contours otherwise within
normal limits. No pleural effusion or pneumothorax. Clear lungs.
IMPRESSION: No acute cardiopulmonary disease.

## 2016-09-30 ENCOUNTER — Other Ambulatory Visit: Payer: Self-pay | Admitting: Family Medicine

## 2016-12-17 ENCOUNTER — Ambulatory Visit (INDEPENDENT_AMBULATORY_CARE_PROVIDER_SITE_OTHER): Payer: Medicare Other | Admitting: Family Medicine

## 2016-12-17 ENCOUNTER — Ambulatory Visit (INDEPENDENT_AMBULATORY_CARE_PROVIDER_SITE_OTHER): Payer: Medicare Other

## 2016-12-17 ENCOUNTER — Encounter: Payer: Self-pay | Admitting: Family Medicine

## 2016-12-17 VITALS — BP 162/82 | HR 68 | Temp 98.1°F | Ht 62.0 in | Wt 165.4 lb

## 2016-12-17 DIAGNOSIS — Z86010 Personal history of colonic polyps: Secondary | ICD-10-CM | POA: Diagnosis not present

## 2016-12-17 DIAGNOSIS — Z0000 Encounter for general adult medical examination without abnormal findings: Secondary | ICD-10-CM

## 2016-12-17 DIAGNOSIS — E7800 Pure hypercholesterolemia, unspecified: Secondary | ICD-10-CM

## 2016-12-17 DIAGNOSIS — I10 Essential (primary) hypertension: Secondary | ICD-10-CM | POA: Diagnosis not present

## 2016-12-17 DIAGNOSIS — E2839 Other primary ovarian failure: Secondary | ICD-10-CM

## 2016-12-17 DIAGNOSIS — Z1231 Encounter for screening mammogram for malignant neoplasm of breast: Secondary | ICD-10-CM

## 2016-12-17 DIAGNOSIS — Z1239 Encounter for other screening for malignant neoplasm of breast: Secondary | ICD-10-CM

## 2016-12-17 NOTE — Progress Notes (Signed)
Patient: [place] Harper, Female    DOB: 05-04-1951, 66 y.o.   MRN: [id number] Visit Date: 12/17/2016  Today's Provider: Lelon Huh, MD   Chief Complaint  Patient presents with  . Annual Exam  . Hypertension    follow up  . Hyperlipidemia    follow up   Subjective:   CPE [place] is a 66 y.o. female who presents today for her complete physical. She had Initial PPE with [facility] this morning. She feels fairly well. She reports exercising regularly. She reports she is sleeping fairly well.   Hypertension, follow-up:  BP Readings from Last 3 Encounters:  12/17/16 (!) 162/82  12/02/15 138/85  10/30/15 (!) 150/84    She was last seen for hypertension 1 years ago.  BP at that visit was 150/84. Management since that visit includes no changes. She reports good compliance with treatment. She is not having side effects.  She is exercising. She is adherent to low salt diet.   Outside blood pressures are checked occasionally. She is experiencing none.  Patient denies chest pain, chest pressure/discomfort, claudication, dyspnea, exertional chest pressure/discomfort, fatigue, irregular heart beat, lower extremity edema, near-syncope, orthopnea, palpitations, paroxysmal nocturnal dyspnea, syncope and tachypnea.   Cardiovascular risk factors include advanced age (older than 25 for men, 21 for women) and dyslipidemia.  Use of agents associated with hypertension: NSAIDS.     Weight trend: fluctuating a bit Wt Readings from Last 3 Encounters:  12/17/16 165 lb 6.4 oz (75 kg)  12/02/15 167 lb (75.8 kg)  10/30/15 167 lb (75.8 kg)    Current diet: well balanced  ------------------------------------------------------------------------  Lipid/Cholesterol, Follow-up:   Last seen for this1 years ago.  Management changes since that visit include counseling patient to avoid sweets in diet. . Last Lipid Panel:    Component Value Date/Time   CHOL 214 (H) 10/30/2015 1030   TRIG 111 10/30/2015 1030   HDL 57 10/30/2015 1030   CHOLHDL 3.8 10/30/2015 1030   LDLCALC 135 (H) 10/30/2015 1030    Risk factors for vascular disease include hypercholesterolemia and hypertension  She reports good compliance with treatment. She is not having side effects.  Current symptoms include none and have been stable. Weight trend: fluctuating a bit Prior visit with dietician: no Current diet: well balanced Current exercise: water aerobics  Wt Readings from Last 3 Encounters:  12/17/16 165 lb 6.4 oz (75 kg)  12/02/15 167 lb (75.8 kg)  10/30/15 167 lb (75.8 kg)    -------------------------------------------------------------------   Review of Systems  Constitutional: Negative for chills, fatigue and fever.  HENT: Negative for congestion, ear pain, rhinorrhea, sneezing and sore throat.   Eyes: Negative.  Negative for pain and redness.  Respiratory: Negative for cough, shortness of breath and wheezing.   Cardiovascular: Negative for chest pain and leg swelling.  Gastrointestinal: Negative for abdominal pain, blood in stool, constipation, diarrhea and nausea.  Endocrine: Negative for polydipsia and polyphagia.  Genitourinary: Negative.  Negative for dysuria, flank pain, hematuria, pelvic pain, vaginal bleeding and vaginal discharge.  Musculoskeletal: Negative for arthralgias, back pain, gait problem and joint swelling.  Skin: Negative for rash.       Itchy skin on her back   Neurological: Negative.  Negative for dizziness, tremors, seizures, weakness, light-headedness, numbness and headaches.  Hematological: Negative for adenopathy.  Psychiatric/Behavioral: Negative.  Negative for behavioral problems, confusion and dysphoric mood. The patient is not nervous/anxious and is not hyperactive.     Social History  Social History  . Marital status: Married    Spouse name: N/A  . Number of children: 2  . Years of education: N/A   Occupational History  . Retired     Social History Main Topics  . Smoking status: Never Smoker  . Smokeless tobacco: Never Used  . Alcohol use No  . Drug use: No  . Sexual activity: Not on file   Other Topics Concern  . Not on file   Social History Narrative  . No narrative on file    Past Medical History:  Diagnosis Date  . History of pancreatitis   . Hyperlipidemia   . Hypertension   . Lichen sclerosus      Patient Active Problem List   Diagnosis Date Noted  . Special screening for malignant neoplasms, colon   . Benign neoplasm of sigmoid colon   . Abdominal pain 10/29/2015  . Clavicle enlargement 10/29/2015  . Cough 10/29/2015  . History of shingles 10/29/2015  . Idiopathic or primary livedo reticularis 10/29/2015  . Lichen sclerosus [id number]  . N&V (nausea and vomiting) 10/29/2015  . Pancreatitis 10/29/2015  . Mammographic calcification 09/10/2013  . Abnormal ECG 08/14/2013  . Benign essential HTN 02/23/2010  . Hypercholesteremia 05/31/2008  . Family history of cancer of digestive system 05/29/2008    Past Surgical History:  Procedure Laterality Date  . ABDOMINAL HYSTERECTOMY  1979   vaginal: partial  hysterectomy. No history of cancerous or precancerous conditions  . COLONOSCOPY WITH PROPOFOL N/A 12/02/2015   Procedure: COLONOSCOPY WITH PROPOFOL;  Surgeon: Lucilla Lame, MD;  Location: [facility] ENDOSCOPY;  Service: Endoscopy;  Laterality: N/A;  . mammogram screening  08/20/2013   Bi rads cat 4  . pap smear  2009   Dr. Bayard Males [facility]  . stress echocardiogram  08/31/2013   changes consistent with apical ischemia    Her family history includes Cancer in her father and sister; Colon cancer in her sister.      Current Outpatient Prescriptions:  .  amLODipine-benazepril (LOTREL) 5-10 MG capsule, Take 1 capsule by mouth daily., Disp: 90 capsule, Rfl: 4 .  aspirin 81 MG tablet, Take 81 mg by mouth daily. , Disp: , Rfl:  .  lovastatin (MEVACOR) 20 MG tablet, TAKE 1 TABLET (20 MG TOTAL) BY MOUTH AT  BEDTIME., Disp: 30 tablet, Rfl: 5   Patient Care Team: Birdie Sons, MD as PCP - General (Family Medicine)      Objective:    Vital Signs - Last Recorded  Most recent update: 12/17/2016 9:34 AM by Fabio Neighbors, LPN  BP    075-GRM (BP Location: Right Arm)     Pulse  68     Temp  98.1 F (36.7 C) (Oral)     Ht  5\' 2"  (1.575 m)     Wt  165 lb 6.4 oz (75 kg)      BMI  30.25 kg/m       Physical Exam   General Appearance:    Alert, cooperative, no distress, appears stated age  Head:    Normocephalic, without obvious abnormality, atraumatic  Eyes:    PERRL, conjunctiva/corneas clear, EOM's intact, fundi    benign, both eyes  Ears:    Normal TM's and external ear canals, both ears  Nose:   Nares normal, septum midline, mucosa normal, no drainage    or sinus tenderness  Throat:   Lips, mucosa, and tongue normal; teeth and gums normal  Neck:   Supple, symmetrical, trachea midline,  no adenopathy;    thyroid:  no enlargement/tenderness/nodules; no carotid   bruit or JVD  Back:     Symmetric, no curvature, ROM normal, no CVA tenderness  Lungs:     Clear to auscultation bilaterally, respirations unlabored  Chest Wall:    No tenderness or deformity   Heart:    Regular rate and rhythm, S1 and S2 normal, no murmur, rub   or gallop  Breast Exam:    normal appearance, no masses or tenderness  Abdomen:     Soft, non-tender, bowel sounds active all four quadrants,    no masses, no organomegaly  Pelvic:    deferred  Extremities:   Extremities normal, atraumatic, no cyanosis or edema  Pulses:   2+ and symmetric all extremities  Skin:   Skin color, texture, turgor normal, no rashes or lesions  Lymph nodes:   Cervical, supraclavicular, and axillary nodes normal  Neurologic:   CNII-XII intact, normal strength, sensation and reflexes    throughout       Activities of Daily Living In your present state of health, do you have any difficulty performing the following  activities: 12/17/2016  Hearing? N  Vision? N  Difficulty concentrating or making decisions? N  Walking or climbing stairs? N  Dressing or bathing? N  Doing errands, shopping? N  Preparing Food and eating ? N  Using the Toilet? N  In the past six months, have you accidently leaked urine? N  Do you have problems with loss of bowel control? N  Managing your Medications? N  Managing your Finances? N  Housekeeping or managing your Housekeeping? N  Some recent data might be hidden    Fall Risk Assessment Fall Risk  12/17/2016  Falls in the past year? No     Depression Screen PHQ 2/9 Scores 12/17/2016 10/30/2015  PHQ - 2 Score 0 0  PHQ- 9 Score - 0    Cognitive Function: 6CIT Screen 12/17/2016  What Year? 0 points  What month? 0 points  What time? 0 points  Count back from 20 0 points  Months in reverse 0 points  Repeat phrase 2 points  Total Score 2    Visual Acuity Screening   Right eye Left eye Both eyes  Without correction: 20/20 20/20 20/20   With correction:     Comments: Patient saw all colors     Assessment & Plan:     Initial Preventative Physical Exam  Reviewed patient's Family Medical History Reviewed and updated list of patient's medical providers Assessment of cognitive impairment was done Assessed patient's functional ability Established a written schedule for health screening [place] Completed and Reviewed  Exercise Activities and Dietary recommendations Goals    . Weight (lb) < 140 lb (63.5 kg)          Starting 12/17/16, I will work to lose 25 pounds this year.        Immunization History  Administered Date(s) Administered  . Influenza,inj,Quad PF,36+ Mos 10/30/2015  . Influenza-Unspecified 08/13/2016  . Tdap 05/29/2008  . Zoster 07/10/2014    Health Maintenance  Topic Date Due  . MAMMOGRAM  04/13/2017 (Originally 11/05/2016)  . DEXA SCAN  10/25/2017 (Originally 06/08/2016)  . PNA vac Low Risk Adult (1 of 2 -  PCV13) 10/25/2017 (Originally 06/08/2016)  . PAP SMEAR  10/25/2026 (Originally 06/08/1972)  . HIV Screening  10/25/2026 (Originally 06/08/1966)  . TETANUS/TDAP  05/29/2018  . COLONOSCOPY  12/01/2020  . INFLUENZA VACCINE  Completed  .  Hepatitis C Screening  Completed     Discussed health benefits of physical activity, and encouraged her to engage in regular exercise appropriate for her age and condition.    ------------------------------------------------------------------------------------------------------------  1. Annual physical exam She refused [id number] today but will consider at follow up. Is already scheduled for mammogram.  - Comprehensive metabolic panel - Lipid panel  2. Benign essential HTN Uncontrolled. Consider increasing amlodipine-benazepril after reviewing labs.  - EKG 12-Lead  3. Estrogen deficiency  - DG Bone Density  4. Hypercholesteremia She is tolerating lovastatin well with no adverse effects. . She requests 90 day prescription after reviewing labs.  - Comprehensive metabolic panel - Lipid panel    Lelon Huh, MD  [facility]

## 2016-12-17 NOTE — Progress Notes (Signed)
Subjective:   D[place] Harper is a 66 y.o. female who presents for an Initial Medicare Annual Wellness Visit.  Review of Systems    N/A  Cardiac Risk Factors include: advanced age (>82men, >74 women);dyslipidemia;hypertension     Objective:    Today's Vitals   12/17/16 0926  BP: (!) 162/82  Pulse: 68  Temp: 98.1 F (36.7 C)  TempSrc: Oral  Weight: 165 lb 6.4 oz (75 kg)  Height: 5\' 2"  (1.575 m)  PainSc: 0-No pain   Body mass index is 30.25 kg/m.   Current Medications (verified) Outpatient Encounter Prescriptions as of 12/17/2016  Medication Sig  . amLODipine-benazepril (LOTREL) 5-10 MG capsule Take 1 capsule by mouth daily.  Marland Kitchen aspirin 81 MG tablet Take 81 mg by mouth daily.   Marland Kitchen lovastatin (MEVACOR) 20 MG tablet TAKE 1 TABLET (20 MG TOTAL) BY MOUTH AT BEDTIME.   No facility-administered encounter medications on file as of 12/17/2016.     Allergies (verified) Demerol  [meperidine] and Codeine   History: Past Medical History:  Diagnosis Date  . History of pancreatitis   . Hyperlipidemia   . Hypertension   . Lichen sclerosus    Past Surgical History:  Procedure Laterality Date  . ABDOMINAL HYSTERECTOMY  1979   vaginal: partial  hysterectomy. No history of cancerous or precancerous conditions  . COLONOSCOPY WITH PROPOFOL N/A 12/02/2015   Procedure: COLONOSCOPY WITH PROPOFOL;  Surgeon: Lucilla Lame, MD;  Location: [facility] ENDOSCOPY;  Service: Endoscopy;  Laterality: N/A;  . mammogram screening  08/20/2013   Bi rads cat 4  . pap smear  2009   Dr. Bayard Males [facility]  . stress echocardiogram  08/31/2013   changes consistent with apical ischemia   Family History  Problem Relation Age of Onset  . Cancer Father     stomach cancer  . Cancer Sister     breast cancer  . Colon cancer Sister    Social History   Occupational History  . Retired    Social History Main Topics  . Smoking status: Never Smoker  . Smokeless tobacco: Never Used  . Alcohol use No  . Drug use:  No  . Sexual activity: Not on file    Tobacco Counseling Counseling given: Not Answered   Activities of Daily Living In your present state of health, do you have any difficulty performing the following activities: 12/17/2016  Hearing? N  Vision? N  Difficulty concentrating or making decisions? N  Walking or climbing stairs? N  Dressing or bathing? N  Doing errands, shopping? N  Preparing Food and eating ? N  Using the Toilet? N  In the past six months, have you accidently leaked urine? N  Do you have problems with loss of bowel control? N  Managing your Medications? N  Managing your Finances? N  Housekeeping or managing your Housekeeping? N  Some recent data might be hidden    Immunizations and Health Maintenance Immunization History  Administered Date(s) Administered  . Influenza,inj,Quad PF,36+ Mos 10/30/2015  . Influenza-Unspecified 08/13/2016  . Tdap 05/29/2008  . Zoster 07/10/2014   There are no preventive care reminders to display for this patient.  Patient Care Team: Birdie Sons, MD as PCP - General (Family Medicine)  Indicate any recent Medical Services you may have received from other than [facility] providers in the past year (date may be approximate).     Assessment:   This is a routine wellness examination for [place].   Hearing/Vision screen Vision Screening Comments:  Pt sees Dr Chauncy Passy for vision checks yearly.   Dietary issues and exercise activities discussed: Current Exercise Habits: Structured exercise class, Type of exercise: walking;Other - see comments (water aerobics 3 days a week), Time (Minutes): 60, Frequency (Times/Week): 5, Weekly Exercise (Minutes/Week): 300, Intensity: Mild, Exercise limited by: None identified  Goals    . Weight (lb) < 140 lb (63.5 kg)          Starting 12/17/16, I will work to lose 25 pounds this year.       Depression Screen PHQ 2/9 Scores 12/17/2016 10/30/2015  PHQ - 2 Score 0 0  PHQ- 9 Score - 0    Fall Risk Fall  Risk  12/17/2016  Falls in the past year? No    Cognitive Function:     6CIT Screen 12/17/2016  What Year? 0 points  What month? 0 points  What time? 0 points  Count back from 20 0 points  Months in reverse 0 points  Repeat phrase 2 points  Total Score 2    Screening Tests Health Maintenance  Topic Date Due  . MAMMOGRAM  04/13/2017 (Originally 11/05/2016)  . DEXA SCAN  10/25/2017 (Originally 06/08/2016)  . PNA vac Low Risk Adult (1 of 2 - PCV13) 10/25/2017 (Originally 06/08/2016)  . PAP SMEAR  10/25/2026 (Originally 06/08/1972)  . HIV Screening  10/25/2026 (Originally 06/08/1966)  . TETANUS/TDAP  05/29/2018  . COLONOSCOPY  12/01/2025  . INFLUENZA VACCINE  Completed  . Hepatitis C Screening  Completed      Plan:  I have personally reviewed and addressed the Medicare Annual Wellness questionnaire and have noted the following in the patient's chart:  A. Medical and social history B. Use of alcohol, tobacco or illicit drugs  C. Current medications and supplements D. Functional ability and status E.  Nutritional status F.  Physical activity G. Advance directives H. List of other physicians I.  Hospitalizations, surgeries, and ER visits in previous 12 months J.  [place] such as hearing and vision if needed, cognitive and depression L. Referrals and appointments - none  In addition, I have reviewed and discussed with patient certain preventive protocols, quality metrics, and best practice recommendations. A written personalized care plan for preventive services as well as general preventive health recommendations were provided to patient.  See attached scanned questionnaire for additional information.   Signed,  Fabio Neighbors, LPN Nurse Health Advisor   MD Recommendations: None.  I have reviewed the health advisor's note, was available for consultation, and agree with documentation and plan  Lelon Huh, MD

## 2016-12-17 NOTE — Patient Instructions (Signed)
Health Maintenance, Female Introduction Adopting a healthy lifestyle and getting preventive care can go a long way to promote health and wellness. Talk with your health care provider about what schedule of regular examinations is right for you. This is a good chance for you to check in with your provider about disease prevention and staying healthy. In between checkups, there are plenty of things you can do on your own. Experts have done a lot of research about which lifestyle changes and preventive measures are most likely to keep you healthy. Ask your health care provider for more information. Weight and diet Eat a healthy diet  Be sure to include plenty of vegetables, fruits, low-fat dairy products, and lean protein.  Do not eat a lot of foods high in solid fats, added sugars, or salt.  Get regular exercise. This is one of the most important things you can do for your health.  Most adults should exercise for at least 150 minutes each week. The exercise should increase your heart rate and make you sweat (moderate-intensity exercise).  Most adults should also do strengthening exercises at least twice a week. This is in addition to the moderate-intensity exercise. Maintain a healthy weight  Body mass index (BMI) is a measurement that can be used to identify possible weight problems. It estimates body fat based on height and weight. Your health care provider can help determine your BMI and help you achieve or maintain a healthy weight.  For females 4 years of age and older:  A BMI below 18.5 is considered underweight.  A BMI of 18.5 to 24.9 is normal.  A BMI of 25 to 29.9 is considered overweight.  A BMI of 30 and above is considered obese. Watch levels of cholesterol and blood lipids  You should start having your blood tested for lipids and cholesterol at 66 years of age, then have this test every 5 years.  You may need to have your cholesterol levels checked more often  if:  Your lipid or cholesterol levels are high.  You are older than 66 years of age.  You are at high risk for heart disease. Cancer screening Lung Cancer  Lung cancer screening is recommended for adults 12-31 years old who are at high risk for lung cancer because of a history of smoking.  A yearly low-dose CT scan of the lungs is recommended for people who:  Currently smoke.  Have quit within the past 15 years.  Have at least a 30-pack-year history of smoking. A pack year is smoking an average of one pack of cigarettes a day for 1 year.  Yearly screening should continue until it has been 15 years since you quit.  Yearly screening should stop if you develop a health problem that would prevent you from having lung cancer treatment. Breast Cancer  Practice breast self-awareness. This means understanding how your breasts normally appear and feel.  It also means doing regular breast self-exams. Let your health care provider know about any changes, no matter how small.  If you are in your 20s or 30s, you should have a clinical breast exam (CBE) by a health care provider every 1-3 years as part of a regular health exam.  If you are 74 or older, have a CBE every year. Also consider having a breast X-ray (mammogram) every year.  If you have a family history of breast cancer, talk to your health care provider about genetic screening.  If you are at high risk for breast cancer,  talk to your health care provider about having an MRI and a mammogram every year.  Breast cancer gene (BRCA) assessment is recommended for women who have family members with BRCA-related cancers. BRCA-related cancers include:  Breast.  Ovarian.  Tubal.  Peritoneal cancers.  Results of the assessment will determine the need for genetic counseling and BRCA1 and BRCA2 testing. Cervical Cancer  Your health care provider may recommend that you be screened regularly for cancer of the pelvic organs (ovaries,  uterus, and vagina). This screening involves a pelvic examination, including checking for microscopic changes to the surface of your cervix (Pap test). You may be encouraged to have this screening done every 3 years, beginning at age 21.  For women ages 30-65, health care providers may recommend pelvic exams and Pap testing every 3 years, or they may recommend the Pap and pelvic exam, combined with testing for human papilloma virus (HPV), every 5 years. Some types of HPV increase your risk of cervical cancer. Testing for HPV may also be done on women of any age with unclear Pap test results.  Other health care providers may not recommend any screening for nonpregnant women who are considered low risk for pelvic cancer and who do not have symptoms. Ask your health care provider if a screening pelvic exam is right for you.  If you have had past treatment for cervical cancer or a condition that could lead to cancer, you need Pap tests and screening for cancer for at least 20 years after your treatment. If Pap tests have been discontinued, your risk factors (such as having a new sexual partner) need to be reassessed to determine if screening should resume. Some women have medical problems that increase the chance of getting cervical cancer. In these cases, your health care provider may recommend more frequent screening and Pap tests. Colorectal Cancer  This type of cancer can be detected and often prevented.  Routine colorectal cancer screening usually begins at 66 years of age and continues through 66 years of age.  Your health care provider may recommend screening at an earlier age if you have risk factors for colon cancer.  Your health care provider may also recommend using home test kits to check for hidden blood in the stool.  A small camera at the end of a tube can be used to examine your colon directly (sigmoidoscopy or colonoscopy). This is done to check for the earliest forms of colorectal  cancer.  Routine screening usually begins at age 50.  Direct examination of the colon should be repeated every 5-10 years through 66 years of age. However, you may need to be screened more often if early forms of precancerous polyps or small growths are found. Skin Cancer  Check your skin from head to toe regularly.  Tell your health care provider about any new moles or changes in moles, especially if there is a change in a mole's shape or color.  Also tell your health care provider if you have a mole that is larger than the size of a pencil eraser.  Always use sunscreen. Apply sunscreen liberally and repeatedly throughout the day.  Protect yourself by wearing long sleeves, pants, a wide-brimmed hat, and sunglasses whenever you are outside. Heart disease, diabetes, and high blood pressure  High blood pressure causes heart disease and increases the risk of stroke. High blood pressure is more likely to develop in:  People who have blood pressure in the high end of the normal range (130-139/85-89 mm Hg).    People who are overweight or obese.  People who are African American.  If you are 57-46 years of age, have your blood pressure checked every 3-5 years. If you are 3 years of age or older, have your blood pressure checked every year. You should have your blood pressure measured twice-once when you are at a hospital or clinic, and once when you are not at a hospital or clinic. Record the average of the two measurements. To check your blood pressure when you are not at a hospital or clinic, you can use:  An automated blood pressure machine at a pharmacy.  A home blood pressure monitor.  If you are between 65 years and 26 years old, ask your health care provider if you should take aspirin to prevent strokes.  Have regular diabetes screenings. This involves taking a blood sample to check your fasting blood sugar level.  If you are at a normal weight and have a low risk for diabetes,  have this test once every three years after 66 years of age.  If you are overweight and have a high risk for diabetes, consider being tested at a younger age or more often. Preventing infection Hepatitis B  If you have a higher risk for hepatitis B, you should be screened for this virus. You are considered at high risk for hepatitis B if:  You were born in a country where hepatitis B is common. Ask your health care provider which countries are considered high risk.  Your parents were born in a high-risk country, and you have not been immunized against hepatitis B (hepatitis B vaccine).  You have HIV or AIDS.  You use needles to inject street drugs.  You live with someone who has hepatitis B.  You have had sex with someone who has hepatitis B.  You get hemodialysis treatment.  You take certain medicines for conditions, including cancer, organ transplantation, and autoimmune conditions. Hepatitis C  Blood testing is recommended for:  Everyone born from [birth year] through 1965.  Anyone with known risk factors for hepatitis C. Osteoporosis and menopause  Osteoporosis is a disease in which the bones lose minerals and strength with aging. This can result in serious bone fractures. Your risk for osteoporosis can be identified using a bone density scan.  If you are 40 years of age or older, or if you are at risk for osteoporosis and fractures, ask your health care provider if you should be screened.  Ask your health care provider whether you should take a calcium or vitamin D supplement to lower your risk for osteoporosis.  Menopause may have certain physical symptoms and risks.  Hormone replacement therapy may reduce some of these symptoms and risks. Talk to your health care provider about whether hormone replacement therapy is right for you. Follow these instructions at home:  Schedule regular health, dental, and eye exams.  Stay current with your immunizations.  Do not use any  tobacco products including cigarettes, chewing tobacco, or electronic cigarettes.  If you are pregnant, do not drink alcohol.  If you are breastfeeding, limit how much and how often you drink alcohol.  Limit alcohol intake to no more than 1 drink per day for nonpregnant women. One drink equals 12 ounces of beer, 5 ounces of wine, or 1 ounces of hard liquor.  Do not use street drugs.  Do not share needles.  Ask your health care provider for help if you need support or information about quitting drugs.  Tell your health  care provider if you often feel depressed.  Tell your health care provider if you have ever been abused or do not feel safe at home. This information is not intended to replace advice given to you by your health care provider. Make sure you discuss any questions you have with your health care provider. Document Released: 04/26/2011 Document Revised: 03/18/2016 Document Reviewed: 07/15/2015  2017 Elsevier

## 2016-12-18 LAB — LIPID PANEL
CHOL/HDL RATIO: 3.2 ratio (ref 0.0–4.4)
Cholesterol, Total: 196 mg/dL (ref 100–199)
HDL: 61 mg/dL (ref >39)
LDL CALC: 115 mg/dL — AB (ref 0–99)
Triglycerides: 98 mg/dL (ref 0–149)
VLDL CHOLESTEROL CAL: 20 mg/dL (ref 5–40)

## 2016-12-18 LAB — COMPREHENSIVE METABOLIC PANEL
ALBUMIN: 4.9 g/dL — AB (ref 3.6–4.8)
ALT: 20 IU/L (ref 0–32)
AST: 21 IU/L (ref 0–40)
Albumin/Globulin Ratio: 1.8 (ref 1.2–2.2)
Alkaline Phosphatase: 93 IU/L (ref 39–117)
BILIRUBIN TOTAL: 1.4 mg/dL — AB (ref 0.0–1.2)
BUN / CREAT RATIO: 20 (ref 12–28)
BUN: 18 mg/dL (ref 8–27)
CALCIUM: 9.8 mg/dL (ref 8.7–10.3)
CHLORIDE: 102 mmol/L (ref 96–106)
CO2: 21 mmol/L (ref 18–29)
Creatinine, Ser: 0.91 mg/dL (ref 0.57–1.00)
GFR, EST AFRICAN AMERICAN: 77 mL/min/{1.73_m2} (ref >59)
GFR, EST NON AFRICAN AMERICAN: 66 mL/min/{1.73_m2} (ref >59)
GLUCOSE: 107 mg/dL — AB (ref 65–99)
Globulin, Total: 2.8 g/dL (ref 1.5–4.5)
Potassium: 4.2 mmol/L (ref 3.5–5.2)
Sodium: 143 mmol/L (ref 134–144)
TOTAL PROTEIN: 7.7 g/dL (ref 6.0–8.5)

## 2016-12-20 ENCOUNTER — Telehealth: Payer: Self-pay

## 2016-12-20 MED ORDER — AMLODIPINE BESY-BENAZEPRIL HCL 5-20 MG PO CAPS: 1.0000 | ORAL_CAPSULE | Freq: Every day | ORAL | 3 refills | Status: DC

## 2016-12-20 NOTE — Telephone Encounter (Signed)
Left message to call back  

## 2016-12-20 NOTE — Telephone Encounter (Signed)
Pt advised and FU made. Renaldo Fiddler, CMA

## 2016-12-20 NOTE — Telephone Encounter (Signed)
Advised patient of results. Patient is requesting that you send in a 90 day supply into her pharmacy. She reports that you mentioned that you wanted to increase her BP medication. Is this still the plan? Please advise. [facility] uses [facility] [place]. Contact number is correct. Thanks!

## 2016-12-20 NOTE — Telephone Encounter (Signed)
[facility] for [facility] of [facility] [place]. Needs to schedule follow up in 3 months for BP check.

## 2016-12-20 NOTE — Telephone Encounter (Signed)
-----   Message from Birdie Sons, MD sent at 12/18/2016  9:44 PM EST ----- Labs good. Cholesterol well controlled. Continue current medications. Check yearly.

## 2016-12-23 LAB — HM MAMMOGRAPHY

## 2016-12-28 ENCOUNTER — Encounter: Payer: Self-pay | Admitting: Family Medicine

## 2016-12-30 ENCOUNTER — Other Ambulatory Visit: Payer: Self-pay | Admitting: Family Medicine

## 2017-01-03 ENCOUNTER — Encounter: Payer: Self-pay | Admitting: Family Medicine

## 2017-01-03 DIAGNOSIS — M8580 Other specified disorders of bone density and structure, unspecified site: Secondary | ICD-10-CM | POA: Insufficient documentation

## 2017-01-06 ENCOUNTER — Telehealth: Payer: Self-pay

## 2017-01-06 NOTE — Telephone Encounter (Signed)
Called patient to let her know that we have her bone density report back. It showed that she has osteopenia NOT osteoporosis, and to repeat in 3 years.   I was unable to leave a message due to VM box being full.

## 2017-01-06 NOTE — Telephone Encounter (Signed)
Patient advised.

## 2017-01-31 ENCOUNTER — Other Ambulatory Visit: Payer: Self-pay | Admitting: Family Medicine

## 2017-02-11 ENCOUNTER — Other Ambulatory Visit: Payer: Self-pay | Admitting: Family Medicine

## 2017-02-11 MED ORDER — LOVASTATIN 20 MG PO TABS: 20.0000 mg | ORAL_TABLET | Freq: Every day | ORAL | 3 refills | Status: DC

## 2017-02-11 NOTE — Telephone Encounter (Signed)
[facility] faxed a request for a  90-days supply on the following medication. Thanks CC  lovastatin (MEVACOR) 20 MG tablet  Take 1 tablet ( 20 MG total ) by mouth at bedtime.

## 2017-02-11 NOTE — Telephone Encounter (Signed)
Requesting 90 day supply.

## 2017-03-22 ENCOUNTER — Encounter: Payer: Self-pay | Admitting: Family Medicine

## 2017-03-22 ENCOUNTER — Ambulatory Visit (INDEPENDENT_AMBULATORY_CARE_PROVIDER_SITE_OTHER): Payer: Medicare Other | Admitting: Family Medicine

## 2017-03-22 VITALS — BP 138/88 | HR 85 | Temp 98.2°F | Resp 16 | Wt 167.0 lb

## 2017-03-22 DIAGNOSIS — E7800 Pure hypercholesterolemia, unspecified: Secondary | ICD-10-CM

## 2017-03-22 DIAGNOSIS — I10 Essential (primary) hypertension: Secondary | ICD-10-CM | POA: Diagnosis not present

## 2017-03-22 DIAGNOSIS — Z23 Encounter for immunization: Secondary | ICD-10-CM

## 2017-03-22 NOTE — Progress Notes (Signed)
Patient: Dana Harper Female    DOB: [birth year]   66 y.o.   MRN: [id number] Visit Date: 03/22/2017  Today's Provider: Lelon Huh, MD   Chief Complaint  Patient presents with  . Hypertension    follow up   Subjective:    HPI  Hypertension, follow-up:  BP Readings from Last 3 Encounters:  12/17/16 (!) 162/82  12/02/15 138/85  10/30/15 (!) 150/84    She was last seen for hypertension 3 months ago.  BP at that visit was 162/82. Management since that visit includes increasing Amlodipine-Benazepril to 5-20mg  daily. She reports good compliance with treatment. She is not having side effects.  She is exercising. She is adherent to low salt diet.   Outside blood pressures are checked occasionally. She is experiencing none.  Patient denies chest pain, chest pressure/discomfort, claudication, dyspnea, exertional chest pressure/discomfort, fatigue, irregular heart beat, lower extremity edema, near-syncope, orthopnea, palpitations and paroxysmal nocturnal dyspnea.   Cardiovascular risk factors include advanced age (older than 80 for men, 73 for women) and hypertension.  Use of agents associated with hypertension: NSAIDS.     Weight trend: stable Wt Readings from Last 3 Encounters:  12/17/16 165 lb 6.4 oz (75 kg)  12/02/15 167 lb (75.8 kg)  10/30/15 167 lb (75.8 kg)    Current diet: well balanced  ------------------------------------------------------------------------     Allergies  Allergen Reactions  . Demerol  [Meperidine]   . Codeine Rash     Current Outpatient Prescriptions:  .  amLODipine-benazepril (LOTREL) 5-20 MG capsule, Take 1 capsule by mouth daily., Disp: , Rfl: 3 .  aspirin 81 MG tablet, Take 81 mg by mouth daily. , Disp: , Rfl:  .  Coenzyme Q10 (COQ10) 100 MG CAPS, Take 1 capsule by mouth daily., Disp: , Rfl:  .  lovastatin (MEVACOR) 20 MG tablet, Take 1 tablet (20 mg total) by mouth at bedtime., Disp: 90 tablet, Rfl: 3  Review of Systems    Constitutional: Negative for appetite change, chills, fatigue and fever.  Respiratory: Negative for chest tightness and shortness of breath.   Cardiovascular: Negative for chest pain and palpitations.  Gastrointestinal: Negative for abdominal pain, nausea and vomiting.  Neurological: Negative for dizziness and weakness.    Social History  Substance Use Topics  . Smoking status: Never Smoker  . Smokeless tobacco: Never Used  . Alcohol use No   Objective:   BP 140/88 (BP Location: Left Arm, Patient Position: Sitting, Cuff Size: Large)   Pulse 85   Temp 98.2 F (36.8 C) (Oral)   Resp 16   Wt 167 lb (75.8 kg)   SpO2 98% Comment: room air  BMI 30.54 kg/m  Vitals:   03/22/17 0821 03/22/17 0849  BP: 140/88 138/88  Pulse: 85   Resp: 16   Temp: 98.2 F (36.8 C)   TempSrc: Oral   SpO2: 98%   Weight: 167 lb (75.8 kg)      Physical Exam  General appearance: alert, well developed, well nourished, cooperative and in no distress Head: Normocephalic, without obvious abnormality, atraumatic Respiratory: Respirations even and unlabored, normal respiratory rate Extremities: No gross deformities Skin: Skin color, texture, turgor normal. No rashes seen  Psych: Appropriate mood and affect. Neurologic: Mental status: Alert, oriented to person, place, and time, thought content appropriate.     Assessment & Plan:     1. Benign essential HTN Improved with higher dose of amlodipine-benazepril. Continue current medications.    2. Hypercholesteremia She is  tolerating lovastatin well with no adverse effects.    3. Need for pneumococcal vaccination She declined Prevnar-13 today, but states she will probably be willing to get it when she gets her flu shot in the fall.        Lelon Huh, MD  [facility]

## 2017-04-18 ENCOUNTER — Ambulatory Visit (INDEPENDENT_AMBULATORY_CARE_PROVIDER_SITE_OTHER): Payer: Medicare Other | Admitting: Physician Assistant

## 2017-04-18 ENCOUNTER — Encounter: Payer: Self-pay | Admitting: Physician Assistant

## 2017-04-18 VITALS — BP 140/90 | HR 88 | Temp 98.8°F | Resp 16 | Wt 164.6 lb

## 2017-04-18 DIAGNOSIS — L309 Dermatitis, unspecified: Secondary | ICD-10-CM

## 2017-04-18 MED ORDER — TRIAMCINOLONE ACETONIDE 0.1 % EX CREA: 1.0000 "application " | TOPICAL_CREAM | Freq: Two times a day (BID) | CUTANEOUS | 0 refills | Status: DC

## 2017-04-18 MED ORDER — PREDNISONE 10 MG (21) PO TBPK: ORAL_TABLET | ORAL | 0 refills | Status: DC

## 2017-04-18 NOTE — Progress Notes (Signed)
       Patient: Dana Harper Female    DOB: 1951-06-25   66 y.o.   MRN: [id number] Visit Date: 04/18/2017  Today's Provider: Mar Daring, PA-C   Chief Complaint  Patient presents with  . Rash   Subjective:    HPI Patient here today C/O rash on torso, back and arms since yesterday. Patient reports that over the weekend she was working out in the yard. Patient reports taking benadryl and using hydrocortisone 1% for itching. She does report that she used a new soap that was suppose to help prevent mosquito bites. She does not know if this caused the rash. It is very pruritic.     Allergies  Allergen Reactions  . Demerol  [Meperidine]   . Codeine Rash     Current Outpatient Prescriptions:  .  amLODipine-benazepril (LOTREL) 5-20 MG capsule, Take 1 capsule by mouth daily., Disp: , Rfl: 3 .  aspirin 81 MG tablet, Take 81 mg by mouth daily. , Disp: , Rfl:  .  Coenzyme Q10 (COQ10) 100 MG CAPS, Take 1 capsule by mouth daily., Disp: , Rfl:  .  lovastatin (MEVACOR) 20 MG tablet, Take 1 tablet (20 mg total) by mouth at bedtime., Disp: 90 tablet, Rfl: 3  Review of Systems  Constitutional: Negative.   Respiratory: Negative.   Cardiovascular: Negative.   Gastrointestinal: Negative.   Skin: Positive for rash.    Social History  Substance Use Topics  . Smoking status: Never Smoker  . Smokeless tobacco: Never Used  . Alcohol use No   Objective:   BP 140/90 (BP Location: Left Arm, Patient Position: Sitting, Cuff Size: Large)   Pulse 88   Temp 98.8 F (37.1 C) (Oral)   Resp 16   Wt 164 lb 9.6 oz (74.7 kg)   SpO2 99%   BMI 30.11 kg/m  Vitals:   04/18/17 0817  BP: 140/90  Pulse: 88  Resp: 16  Temp: 98.8 F (37.1 C)  TempSrc: Oral  SpO2: 99%  Weight: 164 lb 9.6 oz (74.7 kg)     Physical Exam  Constitutional: She appears well-developed and well-nourished. No distress.  Neck: Normal range of motion. Neck supple.  Cardiovascular: Normal rate, regular rhythm and  normal heart sounds.  Exam reveals no gallop and no friction rub.   No murmur heard. Pulmonary/Chest: Effort normal and breath sounds normal. No respiratory distress. She has no wheezes. She has no rales.  Skin: Rash noted. Rash is macular. She is not diaphoretic.  Reticular, lacy rash noted on torso from groin to just below breast anterior and posterior. None on legs, arms or face.  Vitals reviewed.       Assessment & Plan:     1. Dermatitis Possible contact dermatitis from new soap. Will give prednisone as below and triamcinolone for spot treatment. She may also continue to use benadryl prn. She is to call if no improvement. - predniSONE (STERAPRED UNI-PAK 21 TAB) 10 MG (21) TBPK tablet; Take as directed on package instructions  Dispense: 21 tablet; Refill: 0 - triamcinolone cream (KENALOG) 0.1 %; Apply 1 application topically 2 (two) times daily.  Dispense: 30 g; Refill: 0       Mar Daring, PA-C  [facility]

## 2017-04-18 NOTE — Patient Instructions (Signed)
Contact Dermatitis Dermatitis is redness, soreness, and swelling (inflammation) of the skin. Contact dermatitis is a reaction to certain substances that touch the skin. You either touched something that irritated your skin, or you have allergies to something you touched. Follow these instructions at home: Skin Care  Moisturize your skin as needed.  Apply cool compresses to the affected areas.  Try taking a bath with: ? Epsom salts. Follow the instructions on the package. You can get these at a pharmacy or grocery store. ? Baking soda. Pour a small amount into the bath as told by your doctor. ? Colloidal oatmeal. Follow the instructions on the package. You can get this at a pharmacy or grocery store.  Try applying baking soda paste to your skin. Stir water into baking soda until it looks like paste.  Do not scratch your skin.  Bathe less often.  Bathe in lukewarm water. Avoid using hot water. Medicines  Take or apply over-the-counter and prescription medicines only as told by your doctor.  If you were prescribed an antibiotic medicine, take or apply your antibiotic as told by your doctor. Do not stop taking the antibiotic even if your condition starts to get better. General instructions  Keep all follow-up visits as told by your doctor. This is important.  Avoid the substance that caused your reaction. If you do not know what caused it, keep a journal to try to track what caused it. Write down: ? What you eat. ? What cosmetic products you use. ? What you drink. ? What you wear in the affected area. This includes jewelry.  If you were given a bandage (dressing), take care of it as told by your doctor. This includes when to change and remove it. Contact a doctor if:  You do not get better with treatment.  Your condition gets worse.  You have signs of infection such as: ? Swelling. ? Tenderness. ? Redness. ? Soreness. ? Warmth.  You have a fever.  You have new  symptoms. Get help right away if:  You have a very bad headache.  You have neck pain.  Your neck is stiff.  You throw up (vomit).  You feel very sleepy.  You see red streaks coming from the affected area.  Your bone or joint underneath the affected area becomes painful after the skin has healed.  The affected area turns darker.  You have trouble breathing. This information is not intended to replace advice given to you by your health care provider. Make sure you discuss any questions you have with your health care provider. Document Released: 08/08/2009 Document Revised: 03/18/2016 Document Reviewed: 02/26/2015 Elsevier Interactive Patient Education  2018 [employer]  

## 2017-08-20 ENCOUNTER — Ambulatory Visit: Payer: Medicare Other

## 2017-12-20 ENCOUNTER — Ambulatory Visit (INDEPENDENT_AMBULATORY_CARE_PROVIDER_SITE_OTHER): Payer: Medicare Other

## 2017-12-20 ENCOUNTER — Ambulatory Visit (INDEPENDENT_AMBULATORY_CARE_PROVIDER_SITE_OTHER): Payer: Medicare Other | Admitting: Family Medicine

## 2017-12-20 ENCOUNTER — Encounter: Payer: Self-pay | Admitting: Family Medicine

## 2017-12-20 VITALS — BP 148/82

## 2017-12-20 VITALS — BP 142/84 | HR 88 | Temp 98.1°F | Ht 62.0 in | Wt 162.2 lb

## 2017-12-20 DIAGNOSIS — Z0000 Encounter for general adult medical examination without abnormal findings: Secondary | ICD-10-CM

## 2017-12-20 DIAGNOSIS — I10 Essential (primary) hypertension: Secondary | ICD-10-CM | POA: Diagnosis not present

## 2017-12-20 DIAGNOSIS — Z23 Encounter for immunization: Secondary | ICD-10-CM | POA: Diagnosis not present

## 2017-12-20 DIAGNOSIS — Z6829 Body mass index (BMI) 29.0-29.9, adult: Secondary | ICD-10-CM

## 2017-12-20 DIAGNOSIS — E7800 Pure hypercholesterolemia, unspecified: Secondary | ICD-10-CM

## 2017-12-20 DIAGNOSIS — M8589 Other specified disorders of bone density and structure, multiple sites: Secondary | ICD-10-CM

## 2017-12-20 NOTE — Progress Notes (Signed)
Patient: Dana Harper, Female    DOB: 1951-08-21, 67 y.o.   MRN: [id number] Visit Date: 12/20/2017  Today's Provider: Lelon Huh, MD   Chief Complaint  Patient presents with  . Annual Exam  . Hypertension  . Hyperlipidemia   Subjective:     Complete Physical [name] is a 67 y.o. female. She feels well. She reports exercising 5 times a week. She reports she is sleeping well.  -----------------------------------------------------------  Hypertension, follow-up:  BP Readings from Last 3 Encounters:  04/18/17 140/90  03/22/17 138/88  12/17/16 (!) 162/82    She was last seen for hypertension 9 months ago.  BP at that visit was 140/88. Management since that visit includes no changes. She reports good compliance with treatment. She is not having side effects.  She is exercising. She is adherent to low salt diet.   Outside blood pressures are 130-140 / 80's. She is experiencing none.  Patient denies chest pain, chest pressure/discomfort, claudication, dyspnea, exertional chest pressure/discomfort, fatigue, irregular heart beat, lower extremity edema, near-syncope, orthopnea, palpitations, paroxysmal nocturnal dyspnea, syncope and tachypnea.   Cardiovascular risk factors include advanced age (older than 32 for men, 55 for women), dyslipidemia and hypertension.  Use of agents associated with hypertension: NSAIDS.     Weight trend: fluctuating a bit Wt Readings from Last 3 Encounters:  04/18/17 164 lb 9.6 oz (74.7 kg)  03/22/17 167 lb (75.8 kg)  12/17/16 165 lb 6.4 oz (75 kg)    Current diet: in general, a "healthy" diet    ------------------------------------------------------------------------  Lipid/Cholesterol, Follow-up:   Last seen for this 9 months ago.  Management changes since that visit include no changes. . Last Lipid Panel:    Component Value Date/Time   CHOL 196 12/17/2016 1112   TRIG 98 12/17/2016 1112   HDL 61 12/17/2016 1112     CHOLHDL 3.2 12/17/2016 1112   LDLCALC 115 (H) 12/17/2016 1112    Risk factors for vascular disease include hypercholesterolemia and hypertension  She reports good compliance with treatment. She is not having side effects.  Current symptoms include none and have been stable. Weight trend: fluctuating a bit Prior visit with dietician: no Current diet: in general, a "healthy" diet   Current exercise: cardiovascular workout on exercise equipment and weightlifting  Wt Readings from Last 3 Encounters:  04/18/17 164 lb 9.6 oz (74.7 kg)  03/22/17 167 lb (75.8 kg)  12/17/16 165 lb 6.4 oz (75 kg)    -------------------------------------------------------------------   Review of Systems  Constitutional: Negative for chills, fatigue and fever.  HENT: Negative for congestion, ear pain, rhinorrhea, sneezing and sore throat.   Eyes: Negative.  Negative for pain and redness.  Respiratory: Negative for cough, shortness of breath and wheezing.   Cardiovascular: Negative for chest pain and leg swelling.  Gastrointestinal: Negative for abdominal pain, blood in stool, constipation, diarrhea and nausea.  Endocrine: Negative for polydipsia and polyphagia.  Genitourinary: Negative.  Negative for dysuria, flank pain, hematuria, pelvic pain, vaginal bleeding and vaginal discharge.  Musculoskeletal: Negative for arthralgias, back pain, gait problem and joint swelling.  Skin: Negative for rash.  Neurological: Negative.  Negative for dizziness, tremors, seizures, weakness, light-headedness, numbness and headaches.  Hematological: Negative for adenopathy.  Psychiatric/Behavioral: Negative.  Negative for behavioral problems, confusion and dysphoric mood. The patient is not nervous/anxious and is not hyperactive.     Social History   Socioeconomic History  . Marital status: Married    Spouse name: Not  on file  . Number of children: 2  . Years of education: Not on file  . Highest education level: Not  on file  Social Needs  . Financial resource strain: Not on file  . Food insecurity - worry: Not on file  . Food insecurity - inability: Not on file  . Transportation needs - medical: Not on file  . Transportation needs - non-medical: Not on file  Occupational History  . Occupation: Retired  Tobacco Use  . Smoking status: Never Smoker  . Smokeless tobacco: Never Used  Substance and Sexual Activity  . Alcohol use: No  . Drug use: No  . Sexual activity: Not on file  Other Topics Concern  . Not on file  Social History Narrative  . Not on file    Past Medical History:  Diagnosis Date  . History of pancreatitis   . Hyperlipidemia   . Hypertension   . Lichen sclerosus      Patient Active Problem List   Diagnosis Date Noted  . Osteopenia 01/03/2017  . History of adenomatous polyp of colon   . Abdominal pain 10/29/2015  . Clavicle enlargement 10/29/2015  . History of shingles 10/29/2015  . Idiopathic or primary livedo reticularis 10/29/2015  . Lichen sclerosus 16/07/9603  . Pancreatitis 10/29/2015  . Mammographic calcification 09/10/2013  . Abnormal ECG 08/14/2013  . Benign essential HTN 02/23/2010  . Hypercholesteremia 05/31/2008  . Family history of cancer of digestive system 05/29/2008    Past Surgical History:  Procedure Laterality Date  . ABDOMINAL HYSTERECTOMY  1979   vaginal: partial  hysterectomy. No history of cancerous or precancerous conditions  . COLONOSCOPY WITH PROPOFOL N/A 12/02/2015   Procedure: COLONOSCOPY WITH PROPOFOL;  Surgeon: Lucilla Lame, MD;  Location: [facility] ENDOSCOPY;  Service: Endoscopy;  Laterality: N/A;  . mammogram screening  08/20/2013   Bi rads cat 4  . pap smear  2009   Dr. Bayard Males [place]  . stress echocardiogram  08/31/2013   changes consistent with apical ischemia    Her family history includes Cancer in her father and sister; Colon cancer in her sister.      Current Outpatient Medications:  .  amLODipine-benazepril (LOTREL) 5-20  MG capsule, Take 1 capsule by mouth daily., Disp: , Rfl: 3 .  aspirin 81 MG tablet, Take 81 mg by mouth daily. , Disp: , Rfl:  .  Coenzyme Q10 (COQ10) 100 MG CAPS, Take 1 capsule by mouth daily., Disp: , Rfl:  .  lovastatin (MEVACOR) 20 MG tablet, Take 1 tablet (20 mg total) by mouth at bedtime., Disp: 90 tablet, Rfl: 3  Patient Care Team: Birdie Sons, MD as PCP - General (Family Medicine)     Objective:   Vitals: BP (!) 148/82 (BP Location: Left Arm, Patient Position: Sitting, Cuff Size: Large)  Most recent update: 12/20/2017 8:45 AM by Fabio Neighbors, LPN  BP  540/98 Abnormal   (BP Location: Left Arm)     Pulse  88     Temp  98.1 F (36.7 C) (Oral)     Ht  5\' 2"  (1.575 m)     Wt  162 lb 3.2 oz (73.6 kg)      BMI  29.67 kg/m       Physical Exam   General Appearance:    Alert, cooperative, no distress, appears stated age  Head:    Normocephalic, without obvious abnormality, atraumatic  Eyes:    PERRL, conjunctiva/corneas clear, EOM's intact, fundi    benign, both  eyes  Ears:    Normal TM's and external ear canals, both ears  Nose:   Nares normal, septum midline, mucosa normal, no drainage    or sinus tenderness  Throat:   Lips, mucosa, and tongue normal; teeth and gums normal  Neck:   Supple, symmetrical, trachea midline, no adenopathy;    thyroid:  no enlargement/tenderness/nodules; no carotid   bruit or JVD  Back:     Symmetric, no curvature, ROM normal, no CVA tenderness  Lungs:     Clear to auscultation bilaterally, respirations unlabored  Chest Wall:    No tenderness or deformity   Heart:    Regular rate and rhythm, S1 and S2 normal, no murmur, rub   or gallop  Breast Exam:    deferred  Abdomen:     Soft, non-tender, bowel sounds active all four quadrants,    no masses, no organomegaly  Pelvic:    deferred  Extremities:   Extremities normal, atraumatic, no cyanosis or edema  Pulses:   2+ and symmetric all extremities  Skin:   Skin color,  texture, turgor normal, no rashes or lesions  Lymph nodes:   Cervical, supraclavicular, and axillary nodes normal  Neurologic:   CNII-XII intact, normal strength, sensation and reflexes    throughout    Activities of Daily Living In your present state of health, do you have any difficulty performing the following activities: 12/20/2017  Hearing? N  Vision? N  Difficulty concentrating or making decisions? N  Walking or climbing stairs? N  Dressing or bathing? N  Doing errands, shopping? N  Preparing Food and eating ? N  Using the Toilet? N  In the past six months, have you accidently leaked urine? N  Do you have problems with loss of bowel control? N  Managing your Medications? N  Managing your Finances? N  Housekeeping or managing your Housekeeping? N  Some recent data might be hidden    Fall Risk Assessment Fall Risk  12/20/2017 12/17/2016  Falls in the past year? No No     Depression Screen PHQ 2/9 Scores 12/20/2017 12/20/2017 12/17/2016 10/30/2015  PHQ - 2 Score 0 0 0 0  PHQ- 9 Score 0 - - 0    Cognitive Testing - 6-CIT- patient declined today.     Assessment & Plan:    Annual Physical Reviewed patient's Family Medical History Reviewed and updated list of patient's medical providers Assessment of cognitive impairment was done Assessed patient's functional ability Established a written schedule for health screening [place] Completed and Reviewed  Exercise Activities and Dietary recommendations Goals    . Weight (lb) < 140 lb (63.5 kg)     Starting 12/17/16, I will work to lose 25 pounds this year.        Immunization History  Administered Date(s) Administered  . Influenza Split 10/22/2010  . Influenza,inj,Quad PF,6+ Mos 08/14/2013, 10/30/2015  . Influenza-Unspecified 08/13/2016, 08/12/2017  . Tdap 05/29/2008  . Zoster 07/10/2014    Health Maintenance  Topic Date Due  . PNA vac Low Risk Adult (1 of 2 - PCV13) 06/08/2016  . MAMMOGRAM   12/23/2017  . TETANUS/TDAP  05/29/2018  . COLONOSCOPY  12/01/2020  . INFLUENZA VACCINE  Completed  . DEXA SCAN  Completed  . Hepatitis C Screening  Completed     Discussed health benefits of physical activity, and encouraged her to engage in regular exercise appropriate for her age and condition.    ------------------------------------------------------------------------------------------------------------  1. Annual physical exam  Generally doing well with unremarkable exam.   2. BMI 29.0-29.9,adult Given 1600 cal diet information .  3. Need for pneumococcal vaccination  - Pneumococcal conjugate vaccine 13-valent  4. Benign essential HTN SBP elevated today, but usually in the 130s at home. Continue current medications for now. She is to call if she sees blood pressure consistently in the 130s.  - EKG 12-Lead  5. Hypercholesteremia She is tolerating lovastatin well with no adverse effects.   - Comprehensive metabolic panel - Lipid panel - VITAMIN D 25 Hydroxy (Vit-D Deficiency, Fractures) - TSH  6. Osteopenia of multiple sites  - VITAMIN D 25 Hydroxy (Vit-D Deficiency, Fractures) - TSH   Lelon Huh, MD  [facility]

## 2017-12-20 NOTE — Patient Instructions (Signed)
   The CDC recommends two doses of Shingrix (the shingles vaccine) separated by 2 to 6 months for adults age 67 years and older. I recommend checking with your insurance plan regarding coverage for this vaccine.   

## 2017-12-20 NOTE — Progress Notes (Signed)
Subjective:   Dana Harper is a 67 y.o. female who presents for Medicare Annual (Subsequent) preventive examination.  Review of Systems:  N/A  Cardiac Risk Factors include: advanced age (>32men, >57 women);dyslipidemia;hypertension     Objective:     Vitals: BP (!) 142/84 (BP Location: Left Arm)   Pulse 88   Temp 98.1 F (36.7 C) (Oral)   Ht 5\' 2"  (1.575 m)   Wt 162 lb 3.2 oz (73.6 kg)   BMI 29.67 kg/m   Body mass index is 29.67 kg/m.  Advanced Directives 12/20/2017 12/17/2016 12/02/2015  Does Patient Have a Medical Advance Directive? No No No  Would patient like information on creating a medical advance directive? No - Patient declined No - Patient declined No - patient declined information    Tobacco Social History   Tobacco Use  Smoking Status Never Smoker  Smokeless Tobacco Never Used     Counseling given: Not Answered   Clinical Intake:  Pre-visit preparation completed: Yes  Pain : No/denies pain Pain Score: 0-No pain     Nutritional Status: BMI 25 -29 Overweight Nutritional Risks: None Diabetes: No  How often do you need to have someone help you when you read instructions, pamphlets, or other written materials from your doctor or pharmacy?: 1 - Never  Interpreter Needed?: No  Information entered by :: [facility], LPN  Past Medical History:  Diagnosis Date  . History of pancreatitis   . Hyperlipidemia   . Hypertension   . Lichen sclerosus    Past Surgical History:  Procedure Laterality Date  . ABDOMINAL HYSTERECTOMY  1979   vaginal: partial  hysterectomy. No history of cancerous or precancerous conditions  . COLONOSCOPY WITH PROPOFOL N/A 12/02/2015   Procedure: COLONOSCOPY WITH PROPOFOL;  Surgeon: Dana Lame, MD;  Location: [facility] ENDOSCOPY;  Service: Endoscopy;  Laterality: N/A;  . mammogram screening  08/20/2013   Bi rads cat 4  . pap smear  2009   Dr. Bayard Males [place]  . stress echocardiogram  08/31/2013   changes consistent with apical  ischemia   Family History  Problem Relation Age of Onset  . Cancer Father        stomach cancer  . Cancer Sister        breast cancer  . Colon cancer Sister    Social History   Socioeconomic History  . Marital status: Married    Spouse name: None  . Number of children: 2  . Years of education: None  . Highest education level: Some college, no degree  Social Needs  . Financial resource strain: Not hard at all  . Food insecurity - worry: Never true  . Food insecurity - inability: Never true  . Transportation needs - medical: No  . Transportation needs - non-medical: No  Occupational History  . Occupation: Retired  Tobacco Use  . Smoking status: Never Smoker  . Smokeless tobacco: Never Used  Substance and Sexual Activity  . Alcohol use: No  . Drug use: No  . Sexual activity: None  Other Topics Concern  . None  Social History Narrative  . None    Outpatient Encounter Medications as of 12/20/2017  Medication Sig  . amLODipine-benazepril (LOTREL) 5-20 MG capsule Take 1 capsule by mouth daily.  Marland Kitchen aspirin 81 MG tablet Take 81 mg by mouth daily.   . Coenzyme Q10 (COQ10) 100 MG CAPS Take 1 capsule by mouth daily.  Marland Kitchen lovastatin (MEVACOR) 20 MG tablet Take 1 tablet (20 mg total) by  mouth at bedtime.  . [DISCONTINUED] predniSONE (STERAPRED UNI-PAK 21 TAB) 10 MG (21) TBPK tablet Take as directed on package instructions  . [DISCONTINUED] triamcinolone cream (KENALOG) 0.1 % Apply 1 application topically 2 (two) times daily.   No facility-administered encounter medications on file as of 12/20/2017.     Activities of Daily Living In your present state of health, do you have any difficulty performing the following activities: 12/20/2017  Hearing? N  Vision? N  Difficulty concentrating or making decisions? N  Walking or climbing stairs? N  Dressing or bathing? N  Doing errands, shopping? N  Preparing Food and eating ? N  Using the Toilet? N  In the past six months, have you  accidently leaked urine? N  Do you have problems with loss of bowel control? N  Managing your Medications? N  Managing your Finances? N  Housekeeping or managing your Housekeeping? N  Some recent data might be hidden    Patient Care Team: Birdie Sons, MD as PCP - General (Family Medicine)    Assessment:   This is a routine wellness examination for [name].  Exercise Activities and Dietary recommendations Current Exercise Habits: Structured exercise class, Type of exercise: treadmill;walking;strength training/weights;stretching, Time (Minutes): > 60(1.5 hour), Frequency (Times/Week): 5, Weekly Exercise (Minutes/Week): 0, Intensity: Mild, Exercise limited by: None identified  Goals    . Weight (lb) < 140 lb (63.5 kg)     Starting 12/17/16, I will work to lose 25 pounds this year.   12/20/17, recommend to continue diet and exercise to aid in weight loss goal.        Fall Risk Fall Risk  12/20/2017 12/17/2016  Falls in the past year? No No   Is the patient's home free of loose throw rugs in walkways, pet beds, electrical cords, etc?   yes      Grab bars in the bathroom? no      Handrails on the stairs?   yes      Adequate lighting?   yes  Timed Get Up and Go performed: N/A  Depression Screen PHQ 2/9 Scores 12/20/2017 12/20/2017 12/17/2016 10/30/2015  PHQ - 2 Score 0 0 0 0  PHQ- 9 Score 0 - - 0     Cognitive Function: Pt declined screening today.      6CIT Screen 12/17/2016  What Year? 0 points  What month? 0 points  What time? 0 points  Count back from 20 0 points  Months in reverse 0 points  Repeat phrase 2 points  Total Score 2    Immunization History  Administered Date(s) Administered  . Influenza Split 10/22/2010  . Influenza,inj,Quad PF,6+ Mos 08/14/2013, 10/30/2015  . Influenza-Unspecified 08/13/2016, 08/12/2017  . Tdap 05/29/2008  . Zoster 07/10/2014    Qualifies for Shingles Vaccine? Due for Shingles vaccine. Declined my offer to administer today.  Education has been provided regarding the importance of this vaccine. Pt has been advised to call her insurance company to determine her out of pocket expense. Advised she may also receive this vaccine at her local pharmacy or Health Dept. Verbalized acceptance and understanding.  Screening Tests Health Maintenance  Topic Date Due  . PNA vac Low Risk Adult (1 of 2 - PCV13) 06/08/2016  . MAMMOGRAM  12/23/2017  . TETANUS/TDAP  05/29/2018  . COLONOSCOPY  12/01/2020  . INFLUENZA VACCINE  Completed  . DEXA SCAN  Completed  . Hepatitis C Screening  Completed    Cancer Screenings: Lung: Low Dose CT Chest recommended  if Age 30-80 years, 36 pack-year currently smoking OR have quit w/in 15years. Patient does not qualify. Breast:  Up to date on Mammogram? Yes   Up to date of Bone Density/Dexa? Yes Colorectal: Up to date  Additional Screenings:  Hepatitis C Screening: Up to date     Plan:  I have personally reviewed and addressed the Medicare Annual Wellness questionnaire and have noted the following in the patient's chart:  A. Medical and social history B. Use of alcohol, tobacco or illicit drugs  C. Current medications and supplements D. Functional ability and status E.  Nutritional status F.  Physical activity G. Advance directives H. List of other physicians I.  Hospitalizations, surgeries, and ER visits in previous 12 months J.  [place] such as hearing and vision if needed, cognitive and depression L. Referrals and appointments - none  In addition, I have reviewed and discussed with patient certain preventive protocols, quality metrics, and best practice recommendations. A written personalized care plan for preventive services as well as general preventive health recommendations were provided to patient.  See attached scanned questionnaire for additional information.   Signed,  Fabio Neighbors, LPN Nurse Health Advisor   Nurse Recommendations: Pt declined the  Prevnar 13 vaccine today. Pt would like to speak about this further with PCP.

## 2017-12-20 NOTE — Patient Instructions (Signed)
Dana Harper , Thank you for taking time to come for your Medicare Wellness Visit. I appreciate your ongoing commitment to your health goals. Please review the following plan we discussed and let me know if I can assist you in the future.   Screening recommendations/referrals: Colonoscopy: Up to date Mammogram: Up to date Bone Density: Up to date Recommended yearly ophthalmology/optometry visit for glaucoma screening and checkup Recommended yearly dental visit for hygiene and checkup  Vaccinations: Influenza vaccine: Up to date Pneumococcal vaccine: Pt declines today.  Tdap vaccine: Up to date Shingles vaccine: Pt declines today.     Advanced directives: Advance directive discussed with you today. Even though you declined this today please call our office should you change your mind and we can give you the proper paperwork for you to fill out.  Conditions/risks identified: Recommend to continue to exercise and diet to help aid in weight loss goal.   Next appointment: 9:00 AM today with Dr Caryn Section.   Preventive Care 67 Years and Older, Female Preventive care refers to lifestyle choices and visits with your health care provider that can promote health and wellness. What does preventive care include?  A yearly physical exam. This is also called an annual well check.  Dental exams once or twice a year.  Routine eye exams. Ask your health care provider how often you should have your eyes checked.  Personal lifestyle choices, including:  Daily care of your teeth and gums.  Regular physical activity.  Eating a healthy diet.  Avoiding tobacco and drug use.  Limiting alcohol use.  Practicing safe sex.  Taking low-dose aspirin every day.  Taking vitamin and mineral supplements as recommended by your health care provider. What happens during an annual well check? The services and screenings done by your health care provider during your annual well check will depend on your age,  overall health, lifestyle risk factors, and family history of disease. Counseling  Your health care provider may ask you questions about your:  Alcohol use.  Tobacco use.  Drug use.  Emotional well-being.  Home and relationship well-being.  Sexual activity.  Eating habits.  History of falls.  Memory and ability to understand (cognition).  Work and work Statistician.  Reproductive health. Screening  You may have the following tests or measurements:  Height, weight, and BMI.  Blood pressure.  Lipid and cholesterol levels. These may be checked every 5 years, or more frequently if you are over 71 years old.  Skin check.  Lung cancer screening. You may have this screening every year starting at age 4 if you have a 30-pack-year history of smoking and currently smoke or have quit within the past 15 years.  Fecal occult blood test (FOBT) of the stool. You may have this test every year starting at age 89.  Flexible sigmoidoscopy or colonoscopy. You may have a sigmoidoscopy every 5 years or a colonoscopy every 10 years starting at age 75.  Hepatitis C blood test.  Hepatitis B blood test.  Sexually transmitted disease (STD) testing.  Diabetes screening. This is done by checking your blood sugar (glucose) after you have not eaten for a while (fasting). You may have this done every 1-3 years.  Bone density scan. This is done to screen for osteoporosis. You may have this done starting at age 51.  Mammogram. This may be done every 1-2 years. Talk to your health care provider about how often you should have regular mammograms. Talk with your health care provider about  your test results, treatment options, and if necessary, the need for more tests. Vaccines  Your health care provider may recommend certain vaccines, such as:  Influenza vaccine. This is recommended every year.  Tetanus, diphtheria, and acellular pertussis (Tdap, Td) vaccine. You may need a Td booster every 10  years.  Zoster vaccine. You may need this after age 46.  Pneumococcal 13-valent conjugate (PCV13) vaccine. One dose is recommended after age 79.  Pneumococcal polysaccharide (PPSV23) vaccine. One dose is recommended after age 52. Talk to your health care provider about which screenings and vaccines you need and how often you need them. This information is not intended to replace advice given to you by your health care provider. Make sure you discuss any questions you have with your health care provider. Document Released: 11/07/2015 Document Revised: 06/30/2016 Document Reviewed: 08/12/2015 Elsevier Interactive Patient Education  2017 [place] Prevention in the Home Falls can cause injuries. They can happen to people of all ages. There are many things you can do to make your home safe and to help prevent falls. What can I do on the outside of my home?  Regularly fix the edges of walkways and driveways and fix any cracks.  Remove anything that might make you trip as you walk through a door, such as a raised step or threshold.  Trim any bushes or trees on the path to your home.  Use bright outdoor lighting.  Clear any walking paths of anything that might make someone trip, such as rocks or tools.  Regularly check to see if handrails are loose or broken. Make sure that both sides of any steps have handrails.  Any raised decks and porches should have guardrails on the edges.  Have any leaves, snow, or ice cleared regularly.  Use sand or salt on walking paths during winter.  Clean up any spills in your garage right away. This includes oil or grease spills. What can I do in the bathroom?  Use night lights.  Install grab bars by the toilet and in the tub and shower. Do not use towel bars as grab bars.  Use non-skid mats or decals in the tub or shower.  If you need to sit down in the shower, use a plastic, non-slip stool.  Keep the floor dry. Clean up any water that  spills on the floor as soon as it happens.  Remove soap buildup in the tub or shower regularly.  Attach bath mats securely with double-sided non-slip rug tape.  Do not have throw rugs and other things on the floor that can make you trip. What can I do in the bedroom?  Use night lights.  Make sure that you have a light by your bed that is easy to reach.  Do not use any sheets or blankets that are too big for your bed. They should not hang down onto the floor.  Have a firm chair that has side arms. You can use this for support while you get dressed.  Do not have throw rugs and other things on the floor that can make you trip. What can I do in the kitchen?  Clean up any spills right away.  Avoid walking on wet floors.  Keep items that you use a lot in easy-to-reach places.  If you need to reach something above you, use a strong step stool that has a grab bar.  Keep electrical cords out of the way.  Do not use floor polish or wax  that makes floors slippery. If you must use wax, use non-skid floor wax.  Do not have throw rugs and other things on the floor that can make you trip. What can I do with my stairs?  Do not leave any items on the stairs.  Make sure that there are handrails on both sides of the stairs and use them. Fix handrails that are broken or loose. Make sure that handrails are as long as the stairways.  Check any carpeting to make sure that it is firmly attached to the stairs. Fix any carpet that is loose or worn.  Avoid having throw rugs at the top or bottom of the stairs. If you do have throw rugs, attach them to the floor with carpet tape.  Make sure that you have a light switch at the top of the stairs and the bottom of the stairs. If you do not have them, ask someone to add them for you. What else can I do to help prevent falls?  Wear shoes that:  Do not have high heels.  Have rubber bottoms.  Are comfortable and fit you well.  Are closed at the  toe. Do not wear sandals.  If you use a stepladder:  Make sure that it is fully opened. Do not climb a closed stepladder.  Make sure that both sides of the stepladder are locked into place.  Ask someone to hold it for you, if possible.  Clearly mark and make sure that you can see:  Any grab bars or handrails.  First and last steps.  Where the edge of each step is.  Use tools that help you move around (mobility aids) if they are needed. These include:  Canes.  Walkers.  Scooters.  Crutches.  Turn on the lights when you go into a dark area. Replace any light bulbs as soon as they burn out.  Set up your furniture so you have a clear path. Avoid moving your furniture around.  If any of your floors are uneven, fix them.  If there are any pets around you, be aware of where they are.  Review your medicines with your doctor. Some medicines can make you feel dizzy. This can increase your chance of falling. Ask your doctor what other things that you can do to help prevent falls. This information is not intended to replace advice given to you by your health care provider. Make sure you discuss any questions you have with your health care provider. Document Released: 08/07/2009 Document Revised: 03/18/2016 Document Reviewed: 11/15/2014 Elsevier Interactive Patient Education  2017 [employer].

## 2017-12-21 ENCOUNTER — Encounter: Payer: Self-pay | Admitting: Family Medicine

## 2017-12-21 DIAGNOSIS — E559 Vitamin D deficiency, unspecified: Secondary | ICD-10-CM | POA: Insufficient documentation

## 2017-12-21 LAB — VITAMIN D 25 HYDROXY (VIT D DEFICIENCY, FRACTURES): Vit D, 25-Hydroxy: 16.4 ng/mL — ABNORMAL LOW (ref 30.0–100.0)

## 2017-12-21 LAB — LIPID PANEL
CHOLESTEROL TOTAL: 196 mg/dL (ref 100–199)
Chol/HDL Ratio: 3.6 ratio (ref 0.0–4.4)
HDL: 55 mg/dL (ref >39)
LDL CALC: 113 mg/dL — AB (ref 0–99)
Triglycerides: 139 mg/dL (ref 0–149)
VLDL Cholesterol Cal: 28 mg/dL (ref 5–40)

## 2017-12-21 LAB — COMPREHENSIVE METABOLIC PANEL
A/G RATIO: 1.7 (ref 1.2–2.2)
ALBUMIN: 4.7 g/dL (ref 3.6–4.8)
ALK PHOS: 88 IU/L (ref 39–117)
ALT: 16 IU/L (ref 0–32)
AST: 21 IU/L (ref 0–40)
BILIRUBIN TOTAL: 1.1 mg/dL (ref 0.0–1.2)
BUN / CREAT RATIO: 14 (ref 12–28)
BUN: 13 mg/dL (ref 8–27)
CO2: 21 mmol/L (ref 20–29)
Calcium: 9.8 mg/dL (ref 8.7–10.3)
Chloride: 104 mmol/L (ref 96–106)
Creatinine, Ser: 0.94 mg/dL (ref 0.57–1.00)
GFR calc Af Amer: 73 mL/min/{1.73_m2} (ref >59)
GFR calc non Af Amer: 63 mL/min/{1.73_m2} (ref >59)
GLOBULIN, TOTAL: 2.7 g/dL (ref 1.5–4.5)
Glucose: 103 mg/dL — ABNORMAL HIGH (ref 65–99)
Potassium: 4.4 mmol/L (ref 3.5–5.2)
SODIUM: 141 mmol/L (ref 134–144)
Total Protein: 7.4 g/dL (ref 6.0–8.5)

## 2017-12-21 LAB — TSH: TSH: 1.35 u[IU]/mL (ref 0.450–4.500)

## 2017-12-22 ENCOUNTER — Other Ambulatory Visit: Payer: Self-pay | Admitting: *Deleted

## 2017-12-22 ENCOUNTER — Telehealth: Payer: Self-pay | Admitting: Family Medicine

## 2017-12-22 MED ORDER — AMLODIPINE BESY-BENAZEPRIL HCL 5-20 MG PO CAPS: 1.0000 | ORAL_CAPSULE | Freq: Every day | ORAL | 4 refills | Status: DC

## 2017-12-22 MED ORDER — LOVASTATIN 20 MG PO TABS: 20.0000 mg | ORAL_TABLET | Freq: Every day | ORAL | 4 refills | Status: DC

## 2017-12-22 NOTE — Telephone Encounter (Signed)
Patient advised to take both.

## 2017-12-22 NOTE — Telephone Encounter (Signed)
Pt stated that she spoke with Sharyn Lull 12/21/17 and was given her lab results and advised that Dr. Caryn Section wanted her to start taking 1000 units of Vit D in addition to a women's multi Vit. Pt stated that the multi vit has 1000 units of Vit D. Pt is asking if Dr. Caryn Section wants her to take that both which would be a total of 2000 units of Vit D. Please advise. Thanks TNP

## 2017-12-26 LAB — HM MAMMOGRAPHY

## 2017-12-29 ENCOUNTER — Encounter: Payer: Self-pay | Admitting: *Deleted

## 2018-11-15 ENCOUNTER — Telehealth: Payer: Self-pay

## 2018-11-15 NOTE — Telephone Encounter (Signed)
Called pt to r/s apt for 12/21/18 (due to being out of office that day) and pt declined and stated she would just skip the AWV this year.  -MM

## 2018-12-07 ENCOUNTER — Telehealth: Payer: Self-pay

## 2018-12-07 NOTE — Telephone Encounter (Signed)
Patient called requesting to speak with McKenzie. She states she wants to reconsider having a wellness exam this year. Please call her back to discuss.

## 2018-12-08 NOTE — Telephone Encounter (Signed)
Pt called to see about scheduling her AWV. Advised pt she eligible after 12/20/18 due to last years AWV falling on that date. I am out of the office on 12/21/18 and pt does not want to push her CPE out further (currently scheduled 12/21/18). Will try again next year.  -MM

## 2018-12-21 ENCOUNTER — Ambulatory Visit (INDEPENDENT_AMBULATORY_CARE_PROVIDER_SITE_OTHER): Payer: Medicare Other | Admitting: Family Medicine

## 2018-12-21 ENCOUNTER — Ambulatory Visit: Payer: Medicare Other

## 2018-12-21 ENCOUNTER — Encounter: Payer: Self-pay | Admitting: Family Medicine

## 2018-12-21 VITALS — BP 148/90 | HR 98 | Temp 98.7°F | Resp 16 | Ht 62.0 in | Wt 162.0 lb

## 2018-12-21 DIAGNOSIS — L989 Disorder of the skin and subcutaneous tissue, unspecified: Secondary | ICD-10-CM

## 2018-12-21 DIAGNOSIS — Z0000 Encounter for general adult medical examination without abnormal findings: Secondary | ICD-10-CM | POA: Diagnosis not present

## 2018-12-21 DIAGNOSIS — M8589 Other specified disorders of bone density and structure, multiple sites: Secondary | ICD-10-CM

## 2018-12-21 DIAGNOSIS — E559 Vitamin D deficiency, unspecified: Secondary | ICD-10-CM

## 2018-12-21 DIAGNOSIS — I456 Pre-excitation syndrome: Secondary | ICD-10-CM | POA: Insufficient documentation

## 2018-12-21 DIAGNOSIS — I10 Essential (primary) hypertension: Secondary | ICD-10-CM | POA: Diagnosis not present

## 2018-12-21 DIAGNOSIS — E7800 Pure hypercholesterolemia, unspecified: Secondary | ICD-10-CM

## 2018-12-21 DIAGNOSIS — Z23 Encounter for immunization: Secondary | ICD-10-CM | POA: Diagnosis not present

## 2018-12-21 NOTE — Progress Notes (Signed)
Patient: Dana Harper, Female    DOB: [birth year], 68 y.o.   MRN: [id number] Visit Date: 12/21/2018  Today's Provider: Lelon Huh, MD   Chief Complaint  Patient presents with  . Medicare Wellness  . Hyperlipidemia  . Hypertension  . Annual Exam   Subjective:     Annual wellness visit Dana Harper is a 68 y.o. female. She feels well. She reports exercising 5 times a week. She reports she is sleeping well.  -----------------------------------------------------------  Hypertension, follow-up:  BP Readings from Last 3 Encounters:  12/21/18 (!) 148/90  12/20/17 (!) 148/82  12/20/17 (!) 142/84    She was last seen for hypertension 1 years ago.  BP at that visit was 148/82. Management since that visit includes no changes. She reports good compliance with treatment. She is not having side effects.  She is exercising. She is not adherent to low salt diet.   Outside blood pressures are 135-150/ 80-89. She is experiencing none.  Patient denies chest pain, chest pressure/discomfort, claudication, dyspnea, exertional chest pressure/discomfort, fatigue, irregular heart beat, lower extremity edema, near-syncope, orthopnea, palpitations, paroxysmal nocturnal dyspnea, syncope and tachypnea.   Cardiovascular risk factors include advanced age (older than 25 for men, 47 for women), dyslipidemia and hypertension.  Use of agents associated with hypertension: NSAIDS.     Weight trend: stable Wt Readings from Last 3 Encounters:  12/21/18 162 lb (73.5 kg)  12/20/17 162 lb 3.2 oz (73.6 kg)  04/18/17 164 lb 9.6 oz (74.7 kg)    Current diet: in general, a "healthy" diet    ------------------------------------------------------------------------  Lipid/Cholesterol, Follow-up:   Last seen for this1 years ago.  Management changes since that visit include none. . Last Lipid Panel:    Component Value Date/Time   CHOL 196 12/20/2017 1001   TRIG 139 12/20/2017 1001   HDL  55 12/20/2017 1001   CHOLHDL 3.6 12/20/2017 1001   LDLCALC 113 (H) 12/20/2017 1001    Risk factors for vascular disease include hypercholesterolemia and hypertension  She reports good compliance with treatment. She is not having side effects.  Current symptoms include none and have been stable. Weight trend: stable Prior visit with dietician: no Current diet: in general, a "healthy" diet   Current exercise: cardiovascular workout on exercise equipment  Wt Readings from Last 3 Encounters:  12/21/18 162 lb (73.5 kg)  12/20/17 162 lb 3.2 oz (73.6 kg)  04/18/17 164 lb 9.6 oz (74.7 kg)    -------------------------------------------------------------------  Follow up for Vitamin D Deficiency:  The patient was last seen for this 1 years ago. Changes made at last visit include advising patient to take OTC Vitamuin D3 1,000 units daily in addition to women's multivitamin.  She reports good compliance with treatment. She feels that condition is Improved. She is not having side effects.   ------------------------------------------------------------------------------------   Review of Systems  Constitutional: Negative for chills, fatigue and fever.  HENT: Negative for congestion, ear pain, rhinorrhea, sneezing and sore throat.   Eyes: Negative.  Negative for pain and redness.  Respiratory: Negative for cough, shortness of breath and wheezing.   Cardiovascular: Negative for chest pain and leg swelling.  Gastrointestinal: Negative for abdominal pain, blood in stool, constipation, diarrhea and nausea.  Endocrine: Negative for polydipsia and polyphagia.  Genitourinary: Negative.  Negative for dysuria, flank pain, hematuria, pelvic pain, vaginal bleeding and vaginal discharge.  Musculoskeletal: Negative for arthralgias, back pain, gait problem and joint swelling.  Skin: Negative for rash.  Skin lesion on upper leg  Neurological: Negative.  Negative for dizziness, tremors, seizures,  weakness, light-headedness, numbness and headaches.  Hematological: Negative for adenopathy.  Psychiatric/Behavioral: Negative.  Negative for behavioral problems, confusion and dysphoric mood. The patient is not nervous/anxious and is not hyperactive.     Social History   Socioeconomic History  . Marital status: Married    Spouse name: Not on file  . Number of children: 2  . Years of education: Not on file  . Highest education level: Some college, no degree  Occupational History  . Occupation: Retired  Scientific laboratory technician  . Financial resource strain: Not hard at all  . Food insecurity:    Worry: Never true    Inability: Never true  . Transportation needs:    Medical: No    Non-medical: No  Tobacco Use  . Smoking status: Never Smoker  . Smokeless tobacco: Never Used  Substance and Sexual Activity  . Alcohol use: No  . Drug use: No  . Sexual activity: Not on file  Lifestyle  . Physical activity:    Days per week: Not on file    Minutes per session: Not on file  . Stress: Only a little  Relationships  . Social connections:    Talks on phone: Not on file    Gets together: Not on file    Attends religious service: Not on file    Active member of club or organization: Not on file    Attends meetings of clubs or organizations: Not on file    Relationship status: Not on file  . Intimate partner violence:    Fear of current or ex partner: Not on file    Emotionally abused: Not on file    Physically abused: Not on file    Forced sexual activity: Not on file  Other Topics Concern  . Not on file  Social History Narrative  . Not on file    Past Medical History:  Diagnosis Date  . History of pancreatitis   . Lichen sclerosus      Patient Active Problem List   Diagnosis Date Noted  . Vitamin D deficiency 12/21/2017  . Osteopenia 01/03/2017  . History of adenomatous polyp of colon   . Abdominal pain 10/29/2015  . Clavicle enlargement 10/29/2015  . History of shingles  10/29/2015  . Idiopathic or primary livedo reticularis 10/29/2015  . Lichen sclerosus 54/62/7035  . Pancreatitis 10/29/2015  . Mammographic calcification 09/10/2013  . Abnormal ECG 08/14/2013  . Benign essential HTN 02/23/2010  . Hypercholesteremia 05/31/2008  . Family history of cancer of digestive system 05/29/2008    Past Surgical History:  Procedure Laterality Date  . ABDOMINAL HYSTERECTOMY  1979   vaginal: partial  hysterectomy. No history of cancerous or precancerous conditions  . COLONOSCOPY WITH PROPOFOL N/A 12/02/2015   Procedure: COLONOSCOPY WITH PROPOFOL;  Surgeon: Lucilla Lame, MD;  Location: [facility] ENDOSCOPY;  Service: Endoscopy;  Laterality: N/A;  . mammogram screening  08/20/2013   Bi rads cat 4  . pap smear  2009   Dr. Bayard Males [place] [facility]  . stress echocardiogram  08/31/2013   changes consistent with apical ischemia    Her family history includes Cancer in her father and sister; Colon cancer in her sister.   Current Outpatient Medications:  .  amLODipine-benazepril (LOTREL) 5-20 MG capsule, Take 1 capsule by mouth daily., Disp: 90 capsule, Rfl: 4 .  aspirin 81 MG tablet, Take 81 mg by mouth daily. , Disp: , Rfl:  .  cholecalciferol (VITAMIN D3) 25 MCG (1000 UT) tablet, Take 1,000 Units by mouth daily., Disp: , Rfl:  .  Coenzyme Q10 (COQ10) 100 MG CAPS, Take 1 capsule by mouth daily., Disp: , Rfl:  .  lovastatin (MEVACOR) 20 MG tablet, Take 1 tablet (20 mg total) by mouth at bedtime., Disp: 90 tablet, Rfl: 4 .  Multiple Vitamin (MULTIVITAMIN) tablet, Take 1 tablet by mouth daily., Disp: , Rfl:   Patient Care Team: Birdie Sons, MD as PCP - General (Family Medicine)    Objective:    Vitals: BP (!) 148/90 (BP Location: Right Arm, Cuff Size: Large)   Pulse 98   Temp 98.7 F (37.1 C) (Oral)   Resp 16   Ht 5\' 2"  (1.575 m)   Wt 162 lb (73.5 kg)   SpO2 98% Comment: room air  BMI 29.63 kg/m   Physical Exam   General Appearance:    Alert, cooperative, no  distress, appears stated age  Head:    Normocephalic, without obvious abnormality, atraumatic  Eyes:    PERRL, conjunctiva/corneas clear, EOM's intact, fundi    benign, both eyes  Ears:    Normal TM's and external ear canals, both ears  Nose:   Nares normal, septum midline, mucosa normal, no drainage    or sinus tenderness  Throat:   Lips, mucosa, and tongue normal; teeth and gums normal  Neck:   Supple, symmetrical, trachea midline, no adenopathy;    thyroid:  no enlargement/tenderness/nodules; no carotid   bruit or JVD  Back:     Symmetric, no curvature, ROM normal, no CVA tenderness  Lungs:     Clear to auscultation bilaterally, respirations unlabored  Chest Wall:    No tenderness or deformity   Heart:    Regular rate and rhythm, S1 and S2 normal, no murmur, rub   or gallop  Breast Exam:    normal appearance, no masses or tenderness  Abdomen:     Soft, non-tender, bowel sounds active all four quadrants,    no masses, no organomegaly  Pelvic:    not indicated; status post hysterectomy, negative ROS  Extremities:   Extremities normal, atraumatic, no cyanosis or edema  Pulses:   2+ and symmetric all extremities  Skin:   Skin color, texture, turgor normal about 3mm darkly pigmented round crusty lesion left lateral thigh.  Lymph nodes:   Cervical, supraclavicular, and axillary nodes normal  Neurologic:   CNII-XII intact, normal strength, sensation and reflexes    throughout    Activities of Daily Living In your present state of health, do you have any difficulty performing the following activities: 12/21/2018  Hearing? N  Vision? N  Difficulty concentrating or making decisions? N  Walking or climbing stairs? N  Dressing or bathing? N  Doing errands, shopping? N  Some recent data might be hidden    Fall Risk Assessment Fall Risk  12/21/2018 12/20/2017 12/17/2016  Falls in the past year? 0 No No  Number falls in past yr: 0 - -  Injury with Fall? 1 - -  Follow up Falls evaluation  completed - -     Depression Screen PHQ 2/9 Scores 12/21/2018 12/20/2017 12/20/2017 12/17/2016  PHQ - 2 Score 0 0 0 0  PHQ- 9 Score 0 0 - -    Cognitive Testing - 6-CIT  Correct? Score   What year is it? yes 0 0 or 4  What month is it? yes 0 0 or 3  Memorize:    Jenny Reichmann,  [place],  36,  [place],      What time is it? (within 1 hour) yes 0 0 or 3  Count backwards from 20 yes 0 0, 2, or 4  Name the months of the year yes 0 0, 2, or 4  Repeat name & address above yes 0 0, 2, 4, 6, 8, or 10       TOTAL SCORE  0/28   Interpretation:  Normal  Normal (0-7) Abnormal (8-28)      Assessment & Plan:     Annual Wellness Visit  Reviewed patient's Family Medical History Reviewed and updated list of patient's medical providers Assessment of cognitive impairment was done Assessed patient's functional ability Established a written schedule for health screening [place] Completed and Reviewed  Exercise Activities and Dietary recommendations Goals    . Weight (lb) < 140 lb (63.5 kg)     Starting 12/17/16, I will work to lose 25 pounds this year.   12/20/17, recommend to continue diet and exercise to aid in weight loss goal.        Immunization History  Administered Date(s) Administered  . Influenza Split 10/22/2010  . Influenza, High Dose Seasonal PF 09/05/2018  . Influenza,inj,Quad PF,6+ Mos 08/14/2013, 10/30/2015  . Influenza-Unspecified 08/13/2016, 08/12/2017  . Pneumococcal Conjugate-13 12/20/2017  . Tdap 05/29/2008  . Zoster 07/10/2014    Health Maintenance  Topic Date Due  . TETANUS/TDAP  05/29/2018  . PNA vac Low Risk Adult (2 of 2 - PPSV23) 12/20/2018  . MAMMOGRAM  12/27/2018  . DEXA SCAN  01/01/2020  . COLONOSCOPY  12/01/2020  . INFLUENZA VACCINE  Completed  . Hepatitis C Screening  Completed     Discussed health benefits of physical activity, and encouraged her to engage in regular exercise appropriate for her age and condition.       2. Annual physical exam Doing well. Is scheduled for routine eye exam tomorrow. Recommend she get Shingrix from pharmacy  3. Skin lesion of left leg She states she had dermatology appointment scheduled with her usual dermatologist. Advised to make sure has this evaluated within the next several weeks.   4. Benign essential HTN SBP not at goal and home SBP mostly in the 130s and 140s. Consider adding thiazide diuretic.  - EKG 12-Lead  5. Vitamin D deficiency  - VITAMIN D 25 Hydroxy (Vit-D Deficiency, Fractures)  6. Osteopenia of multiple sites  - VITAMIN D 25 Hydroxy (Vit-D Deficiency, Fractures)  7. Hypercholesteremia She is tolerating lovastatin well with no adverse effects.   - Comprehensive metabolic panel - Lipid panel  8. Need for pneumococcal vaccination     Lelon Huh, MD  [facility]

## 2018-12-21 NOTE — Patient Instructions (Addendum)
.   Please review the attached list of medications and notify my office if there are any errors.    Please bring all of your medications to every appointment so we can make sure that our medication list is the same as yours.    The CDC recommends two doses of Shingrix (the shingles vaccine) separated by 2 to 6 months for adults age 68 years and older. I recommend checking with your insurance plan regarding coverage for this vaccine.    Please see if your dermatologist can see you to check you the mole on your leg withiin the next month or two

## 2018-12-22 ENCOUNTER — Telehealth: Payer: Self-pay | Admitting: Family Medicine

## 2018-12-22 ENCOUNTER — Other Ambulatory Visit: Payer: Self-pay

## 2018-12-22 DIAGNOSIS — I10 Essential (primary) hypertension: Secondary | ICD-10-CM

## 2018-12-22 LAB — COMPREHENSIVE METABOLIC PANEL
ALT: 20 IU/L (ref 0–32)
AST: 23 IU/L (ref 0–40)
Albumin/Globulin Ratio: 1.8 (ref 1.2–2.2)
Albumin: 4.6 g/dL (ref 3.8–4.8)
Alkaline Phosphatase: 76 IU/L (ref 39–117)
BUN/Creatinine Ratio: 16 (ref 12–28)
BUN: 15 mg/dL (ref 8–27)
Bilirubin Total: 1.3 mg/dL — ABNORMAL HIGH (ref 0.0–1.2)
CO2: 21 mmol/L (ref 20–29)
Calcium: 9.9 mg/dL (ref 8.7–10.3)
Chloride: 104 mmol/L (ref 96–106)
Creatinine, Ser: 0.91 mg/dL (ref 0.57–1.00)
GFR calc Af Amer: 76 mL/min/{1.73_m2} (ref >59)
GFR calc non Af Amer: 65 mL/min/{1.73_m2} (ref >59)
Globulin, Total: 2.5 g/dL (ref 1.5–4.5)
Glucose: 95 mg/dL (ref 65–99)
Potassium: 4.4 mmol/L (ref 3.5–5.2)
Sodium: 139 mmol/L (ref 134–144)
Total Protein: 7.1 g/dL (ref 6.0–8.5)

## 2018-12-22 LAB — LIPID PANEL
Chol/HDL Ratio: 3.3 ratio (ref 0.0–4.4)
Cholesterol, Total: 180 mg/dL (ref 100–199)
HDL: 55 mg/dL (ref >39)
LDL Calculated: 97 mg/dL (ref 0–99)
Triglycerides: 139 mg/dL (ref 0–149)
VLDL Cholesterol Cal: 28 mg/dL (ref 5–40)

## 2018-12-22 LAB — VITAMIN D 25 HYDROXY (VIT D DEFICIENCY, FRACTURES): Vit D, 25-Hydroxy: 24.3 ng/mL — ABNORMAL LOW (ref 30.0–100.0)

## 2018-12-22 MED ORDER — HYDROCHLOROTHIAZIDE 12.5 MG PO TABS: 12.5000 mg | ORAL_TABLET | Freq: Every day | ORAL | 2 refills | Status: DC

## 2018-12-22 NOTE — Telephone Encounter (Signed)
Please review

## 2018-12-22 NOTE — Telephone Encounter (Signed)
Need more information. I have no record of her ever taking hctz.

## 2018-12-22 NOTE — Telephone Encounter (Signed)
Pt calling about the hydrochlorothiazide (HYDRODIURIL) 12.5 MG tablet that was called in for her.  She will need to discuss this medication before taking it.  She had a severe reaction to this a few years back.  Please call pt back.  Thanks, [employer]

## 2018-12-25 NOTE — Telephone Encounter (Signed)
The hctz prescribed is much less potent than the chlorthalidone and should not drop her potassium the way the chlorthalidone did. Also, the Lotrel helps keep her potassium levels up. We'll keep a close eye her potassium levels just to be sure.

## 2018-12-25 NOTE — Telephone Encounter (Signed)
Pt returned call ° °teri °

## 2018-12-25 NOTE — Telephone Encounter (Signed)
Spoke with [name].  She stated she started a diuretic several years ago and was in the ER for low potassium.  In [employer] she was prescribed Chlorthalidone 02/23/2010. Would it be okay to start HCTZ?  Thanks,   -Mickel Baas

## 2018-12-25 NOTE — Telephone Encounter (Signed)
Pt returned missed call.  Please call pt back on cell - [phone]  Thanks, [facility]

## 2018-12-25 NOTE — Telephone Encounter (Signed)
LMTCB 12/25/2018   Thanks,   -Mickel Baas

## 2018-12-26 NOTE — Telephone Encounter (Signed)
Patient advised as directed below.  Thanks,  -Joseline 

## 2018-12-29 LAB — HM MAMMOGRAPHY

## 2019-01-02 ENCOUNTER — Encounter: Payer: Self-pay | Admitting: Family Medicine

## 2019-01-12 ENCOUNTER — Other Ambulatory Visit: Payer: Self-pay | Admitting: Family Medicine

## 2019-01-23 ENCOUNTER — Telehealth: Payer: Self-pay

## 2019-01-23 NOTE — Telephone Encounter (Signed)
Patient has appointment on 02/01/2019 for BP follow-up and would like her BP checked in the car.

## 2019-02-01 ENCOUNTER — Ambulatory Visit: Payer: Self-pay | Admitting: Family Medicine

## 2019-02-28 ENCOUNTER — Ambulatory Visit: Payer: Self-pay | Admitting: Family Medicine

## 2019-03-18 ENCOUNTER — Other Ambulatory Visit: Payer: Self-pay | Admitting: Family Medicine

## 2019-03-18 DIAGNOSIS — I10 Essential (primary) hypertension: Secondary | ICD-10-CM

## 2019-04-03 ENCOUNTER — Ambulatory Visit: Payer: Self-pay | Admitting: Family Medicine

## 2019-04-09 ENCOUNTER — Encounter: Payer: Self-pay | Admitting: Family Medicine

## 2019-04-09 ENCOUNTER — Ambulatory Visit: Payer: Medicare Other | Admitting: Family Medicine

## 2019-04-09 ENCOUNTER — Other Ambulatory Visit: Payer: Self-pay

## 2019-04-09 VITALS — BP 140/80 | HR 81 | Temp 98.5°F | Resp 16 | Wt 152.0 lb

## 2019-04-09 DIAGNOSIS — I10 Essential (primary) hypertension: Secondary | ICD-10-CM | POA: Diagnosis not present

## 2019-04-09 NOTE — Patient Instructions (Signed)
.   Please review the attached list of medications and notify my office if there are any errors.   . Please bring all of your medications to every appointment so we can make sure that our medication list is the same as yours.   

## 2019-04-09 NOTE — Progress Notes (Signed)
Patient: Dana Harper Female    DOB: Apr 29, 1951   68 y.o.   MRN: [id number] Visit Date: 04/09/2019  Today's Provider: Lelon Huh, MD   Chief Complaint  Patient presents with  . Hypertension   Subjective:     HPI  Hypertension, follow-up:  BP Readings from Last 3 Encounters:  04/09/19 140/80  12/21/18 (!) 148/90  12/20/17 (!) 148/82    She was last seen for hypertension 3 months ago.  BP at that visit was 148/90. Management since that visit includes starting HCTZ. She reports good compliance with treatment. She is not having side effects.  She is exercising. She is not adherent to low salt diet.   Outside blood pressures are 130/ 80. She is experiencing none.  Patient denies chest pain, chest pressure/discomfort, claudication, dyspnea, exertional chest pressure/discomfort, fatigue, irregular heart beat, lower extremity edema, near-syncope, orthopnea, palpitations, paroxysmal nocturnal dyspnea, syncope and tachypnea.   Cardiovascular risk factors include advanced age (older than 77 for men, 20 for women) and hypertension.  Use of agents associated with hypertension: NSAIDS.     Weight trend: decreasing steadily Wt Readings from Last 3 Encounters:  04/09/19 152 lb (68.9 kg)  12/21/18 162 lb (73.5 kg)  12/20/17 162 lb 3.2 oz (73.6 kg)    Current diet: well balanced  ------------------------------------------------------------------------  Allergies  Allergen Reactions  . Demerol  [Meperidine]   . Codeine Rash     Current Outpatient Medications:  .  amLODipine-benazepril (LOTREL) 5-20 MG capsule, TAKE 1 CAPSULE BY MOUTH EVERY DAY, Disp: 90 capsule, Rfl: 4 .  aspirin 81 MG tablet, Take 81 mg by mouth daily. , Disp: , Rfl:  .  cholecalciferol (VITAMIN D3) 25 MCG (1000 UT) tablet, Take 1,000 Units by mouth daily., Disp: , Rfl:  .  Coenzyme Q10 (COQ10) 100 MG CAPS, Take 1 capsule by mouth daily., Disp: , Rfl:  .  hydrochlorothiazide (HYDRODIURIL) 12.5  MG tablet, TAKE 1 TABLET BY MOUTH EVERY DAY, Disp: 90 tablet, Rfl: 4 .  lovastatin (MEVACOR) 20 MG tablet, TAKE 1 TABLET BY MOUTH EVERYDAY AT BEDTIME, Disp: 90 tablet, Rfl: 4  Review of Systems  Constitutional: Negative for appetite change, chills, fatigue and fever.  Respiratory: Negative for chest tightness and shortness of breath.   Cardiovascular: Negative for chest pain and palpitations.  Gastrointestinal: Negative for abdominal pain, nausea and vomiting.  Neurological: Negative for dizziness and weakness.    Social History   Tobacco Use  . Smoking status: Never Smoker  . Smokeless tobacco: Never Used  Substance Use Topics  . Alcohol use: No      Objective:   BP 140/80 (BP Location: Left Arm, Patient Position: Sitting, Cuff Size: Large)   Pulse 81   Temp 98.5 F (36.9 C) (Oral)   Resp 16   Wt 152 lb (68.9 kg)   SpO2 98% Comment: room air  BMI 27.80 kg/m  Vitals:   04/09/19 1018  BP: 140/80  Pulse: 81  Resp: 16  Temp: 98.5 F (36.9 C)  TempSrc: Oral  SpO2: 98%  Weight: 152 lb (68.9 kg)     Physical Exam  General appearance: alert, well developed, well nourished, cooperative and in no distress Head: Normocephalic, without obvious abnormality, atraumatic Respiratory: Respirations even and unlabored, normal respiratory rate Extremities: No gross deformities Skin: Skin color, texture, turgor normal. No rashes seen  Psych: Appropriate mood and affect. Neurologic: Mental status: Alert, oriented to person, place, and time, thought content appropriate.  Assessment & Plan    1. Benign essential HTN Improved on low dose of hctz. Is exercising more and being more careful with diet. Continue current medications.  Check- Renal function panel    The entirety of the information documented in the History of Present Illness, Review of Systems and Physical Exam were personally obtained by me. Portions of this information were initially documented by Meyer Cory,  CMA and reviewed by me for thoroughness and accuracy.   Lelon Huh, MD  [facility]

## 2019-04-10 ENCOUNTER — Telehealth: Payer: Self-pay

## 2019-04-10 LAB — RENAL FUNCTION PANEL
Albumin: 4.6 g/dL (ref 3.8–4.8)
BUN/Creatinine Ratio: 9 — ABNORMAL LOW (ref 12–28)
BUN: 7 mg/dL — ABNORMAL LOW (ref 8–27)
CO2: 23 mmol/L (ref 20–29)
Calcium: 9.7 mg/dL (ref 8.7–10.3)
Chloride: 90 mmol/L — ABNORMAL LOW (ref 96–106)
Creatinine, Ser: 0.81 mg/dL (ref 0.57–1.00)
GFR calc Af Amer: 87 mL/min/{1.73_m2} (ref >59)
GFR calc non Af Amer: 75 mL/min/{1.73_m2} (ref >59)
Glucose: 102 mg/dL — ABNORMAL HIGH (ref 65–99)
Phosphorus: 3.2 mg/dL (ref 3.0–4.3)
Potassium: 4 mmol/L (ref 3.5–5.2)
Sodium: 127 mmol/L — ABNORMAL LOW (ref 134–144)

## 2019-04-10 MED ORDER — SPIRONOLACTONE 25 MG PO TABS: 25.0000 mg | ORAL_TABLET | Freq: Every day | ORAL | 0 refills | Status: DC

## 2019-04-10 NOTE — Telephone Encounter (Signed)
Patient advised. RX sent to pharmacy.  

## 2019-04-10 NOTE — Telephone Encounter (Signed)
-----   Message from Birdie Sons, MD sent at 04/10/2019  1:04 PM EDT ----- Sodium level has dropped from 139 to 127 which is much too low. This is due the hctz. Need to stop hctz and start spironolactone 25mg  once daily, #30. rf x 0. Need to recheck renal panel in 2-3 weeks to make sure sodium levels comes back up to normal. Will call her when its time to check it.

## 2019-04-23 ENCOUNTER — Other Ambulatory Visit: Payer: Self-pay

## 2019-04-23 ENCOUNTER — Encounter: Payer: Self-pay | Admitting: Physician Assistant

## 2019-04-23 ENCOUNTER — Ambulatory Visit: Payer: Medicare Other | Admitting: Physician Assistant

## 2019-04-23 VITALS — BP 135/79 | HR 89 | Temp 97.9°F | Resp 16 | Wt 146.8 lb

## 2019-04-23 DIAGNOSIS — J3489 Other specified disorders of nose and nasal sinuses: Secondary | ICD-10-CM

## 2019-04-23 DIAGNOSIS — K120 Recurrent oral aphthae: Secondary | ICD-10-CM | POA: Diagnosis not present

## 2019-04-23 MED ORDER — FIRST-DUKES MOUTHWASH MT SUSP: 5.0000 mL | Freq: Four times a day (QID) | OROMUCOSAL | 0 refills | Status: DC | PRN

## 2019-04-23 MED ORDER — FLONASE SENSIMIST 27.5 MCG/SPRAY NA SUSP: 2.0000 | Freq: Every day | NASAL | 12 refills | Status: DC

## 2019-04-23 NOTE — Progress Notes (Signed)
Patient: Dana Harper Female    DOB: 29-Jul-1951   68 y.o.   MRN: [id number] Visit Date: 04/23/2019  Today's Provider: Mar Daring, PA-C   Chief Complaint  Patient presents with  . Blister   Subjective:    I,Joseline E. Rosas,RMA am acting as a Education administrator for [employer], PA-C.  HPI  Patient here today with c/o blister/lesion on the side of her tongue. Patient notice it last week. Reports that when she eats something it burns. Treatments tried at home: Baking soda with water, warmed salt water.   Allergies  Allergen Reactions  . Demerol  [Meperidine]   . Codeine Rash     Current Outpatient Medications:  .  amLODipine-benazepril (LOTREL) 5-20 MG capsule, TAKE 1 CAPSULE BY MOUTH EVERY DAY, Disp: 90 capsule, Rfl: 4 .  aspirin 81 MG tablet, Take 81 mg by mouth daily. , Disp: , Rfl:  .  cholecalciferol (VITAMIN D3) 25 MCG (1000 UT) tablet, Take 1,000 Units by mouth daily., Disp: , Rfl:  .  Coenzyme Q10 (COQ10) 100 MG CAPS, Take 1 capsule by mouth daily., Disp: , Rfl:  .  lovastatin (MEVACOR) 20 MG tablet, TAKE 1 TABLET BY MOUTH EVERYDAY AT BEDTIME, Disp: 90 tablet, Rfl: 4 .  spironolactone (ALDACTONE) 25 MG tablet, Take 1 tablet (25 mg total) by mouth daily., Disp: 30 tablet, Rfl: 0  Review of Systems  Constitutional: Negative.   HENT: Positive for mouth sores and rhinorrhea. Negative for congestion, ear pain, facial swelling, nosebleeds, postnasal drip, sinus pressure, sore throat and trouble swallowing.   Respiratory: Negative.   Cardiovascular: Negative.   Gastrointestinal: Negative.   Neurological: Negative.     Social History   Tobacco Use  . Smoking status: Never Smoker  . Smokeless tobacco: Never Used  Substance Use Topics  . Alcohol use: No      Objective:   BP 135/79 (BP Location: Left Arm, Patient Position: Sitting, Cuff Size: Large)   Pulse 89   Temp 97.9 F (36.6 C) (Oral)   Resp 16   Wt 146 lb 12.8 oz (66.6 kg)   BMI 26.85 kg/m   Vitals:   04/23/19 1051  BP: 135/79  Pulse: 89  Resp: 16  Temp: 97.9 F (36.6 C)  TempSrc: Oral  Weight: 146 lb 12.8 oz (66.6 kg)     Physical Exam Vitals signs reviewed.  Constitutional:      General: She is not in acute distress.    Appearance: Normal appearance. She is well-developed. She is not ill-appearing or diaphoretic.  HENT:     Head: Normocephalic and atraumatic.     Right Ear: Hearing, tympanic membrane, ear canal and external ear normal.     Left Ear: Hearing, tympanic membrane, ear canal and external ear normal.     Nose: Nose normal.     Mouth/Throat:     Mouth: Mucous membranes are moist. Oral lesions present.     Tongue: Lesions present.     Pharynx: Uvula midline. No oropharyngeal exudate.  Eyes:     General: No scleral icterus.       Right eye: No discharge.        Left eye: No discharge.     Conjunctiva/sclera: Conjunctivae normal.     Pupils: Pupils are equal, round, and reactive to light.  Neck:     Musculoskeletal: Normal range of motion and neck supple.     Thyroid: No thyromegaly.     Trachea:  No tracheal deviation.  Cardiovascular:     Rate and Rhythm: Normal rate and regular rhythm.     Heart sounds: Normal heart sounds. No murmur. No friction rub. No gallop.   Pulmonary:     Effort: Pulmonary effort is normal. No respiratory distress.     Breath sounds: Normal breath sounds. No stridor. No wheezing or rales.  Lymphadenopathy:     Cervical: No cervical adenopathy.  Skin:    General: Skin is warm and dry.  Neurological:     Mental Status: She is alert.     No results found for any visits on 04/23/19.     Assessment & Plan    1. Aphthous ulcer Will give Magic mouthwash as below for the aphthous ulcers. Advised she can also continue salt water gargles. Oragel prn for pain. Tylenol if needed. Call if not improving.  - Diphenhyd-Hydrocort-Nystatin (FIRST-DUKES MOUTHWASH) SUSP; Use as directed 5 mLs in the mouth or throat 4 (four) times  daily as needed.  Dispense: 237 mL; Refill: 0  2. Rhinorrhea Flonase given for rhinorrhea. Call if not improving.  - fluticasone (FLONASE SENSIMIST) 27.5 MCG/SPRAY nasal spray; Place 2 sprays into the nose daily.  Dispense: 10 g; Refill: [place], PA-C  [facility]

## 2019-04-23 NOTE — Patient Instructions (Signed)
Oral Ulcers Oral ulcers are small sores inside the mouth or near the mouth. They may occur on or inside the lips, inside the cheeks, on the tongue, or anywhere else inside or near the mouth. They may be called canker sores or cold sores, which are two types of oral ulcers. Many oral ulcers are harmless and go away on their own. In some cases, oral ulcers may require medical care to determine the cause and proper treatment. What are the causes? Common causes of this condition include:  Infections caused by viruses, bacteria, or fungi.  Emotional stress.  Foods or chemicals that irritate the mouth.  Injury or physical irritation of the mouth.  Medicines.  Allergies.  Tobacco use. Less common causes include:  Skin disease.  A type of herpes virus infection (herpes simplexor herpes zoster).  Oral cancer. In some cases, the cause may not be known. What increases the risk? You are more likely to develop this condition if:  You wear dental braces, dentures, or retainers.  You have poor oral hygiene.  You have sensitive skin.  You have a condition that affects the entire body (systemic condition), such as an immune disorder. What are the signs or symptoms? The main symptom of this condition is having one or more oval-shaped or round ulcers that have red borders. Symptoms may vary depending on the cause. This includes:  Location of the ulcers. Ulcers may be found inside the mouth, on the gums, or on the insides of the lips or cheeks. They may also be found on the lips or on skin that is near the mouth, such as the cheeks or chin.  Pain. Ulcers can be painful and uncomfortable, or they can be painless.  Appearance of the ulcers. They may look like red blisters and be filled with fluid, or they may be white or yellow patches.  Frequency of outbreaks. Ulcers may go away permanently after one outbreak, or they may come back (recur) often or rarely. How is this diagnosed? This  condition is diagnosed with a physical exam. Your health care provider may ask you questions about your lifestyle and your medical history. You may have tests, including:  Blood tests.  Removal of a small number of cells from an ulcer to be examined under a microscope (biopsy). How is this treated? Treatment depends on the severity and cause of the condition. Oral ulcers often go away on their own in 1-2 weeks. Treatment may include medicines, such as:  Medicines to treat a viral infection (antivirals), a bacterial infection (antibiotics), or a fungal infection (antifungals).  Medicines to help control pain. This may include: ? Over-the-counter pain medicines. ? Gel, cream, or spray to numb the area (topical anesthetic) if you have severe pain. ? Other medicines to coat or numb your mouth. Follow these instructions at home: Medicines  Take or use over-the-counter and prescription medicines only as told by your health care provider.  If you were prescribed an antibiotic medicine, take it as told by your health care provider. Do not stop taking the antibiotic even if you start to feel better.  Do not use products that contain benzocaine (including numbing gels) to treat teething or mouth pain in children who are younger than 2 years. These products may cause a rare but serious blood condition. Eating and drinking  Eat a balanced diet. Do not eat: ? Spicy foods. ? Citrus, such as oranges. ? Other foods and drinks that you think may cause or irritate your ulcers.    Drink enough fluid to keep your urine pale yellow.  Avoid drinking alcohol. Lifestyle   Practice good oral hygiene: ? Gently brush your teeth with a soft toothbrush two times a day. ? Floss your teeth every day. ? Get regular dental cleanings and checkups.  Do not use any products that contain nicotine or tobacco, such as cigarettes and e-cigarettes. If you need help quitting, ask your health care provider. Managing  pain  If directed, put ice on your face in the affected area to help reduce pain. ? Put ice in a plastic bag. ? Place a towel between your skin and the bag. ? Leave the ice on for 20 minutes, 2-3 times a day.  Avoid physical or chemical irritants that may have caused the ulcers or made them worse, such as mouthwashes that contain alcohol (ethanol). If you wear dental braces, dentures, or retainers, work with your health care provider to make sure these devices are fitted correctly.  If you were prescribed a prescription mouthwash to help reduce pain in your mouth, use it as told by your health care provider. General instructions  Rinse with a salt-water mixture 3-4 times a day or as told by your health care provider. To make a salt-water mixture, completely dissolve -1 tsp (3-6 g) of salt in 1 cup (237 mL) of warm water.  Keep all follow-up visits as told by your health care provider. This is important. Contact a health care provider if:  You have: ? Pain that gets worse or does not get better with medicine. ? Four or more ulcers at one time. ? A fever. ? New ulcers that look or feel different from other ulcers you have. ? Inflammation in one eye or both eyes. ? Ulcers that do not go away after 10 days.  You develop new symptoms in your mouth, such as: ? Bleeding or crusting around your lips or gums. ? Tooth pain. ? Difficulty swallowing.  You develop symptoms on your skin or genitals, such as: ? A rash or blisters. ? Burning or itching sensations.  Your ulcers begin or get worse after you start a new medicine. Get help right away if you have:  Difficulty breathing.  Swelling in your face or neck.  Excessive bleeding from your mouth.  Severe pain. Summary  Oral ulcers may occur anywhere inside or near the mouth.  They can be caused by many things, such as infections, stress, injury or irritation, or tobacco use.  Oral ulcers can be painful or painless.  Treatment  may include medicines to relieve pain or to treat an infection (if appropriate).  Most oral ulcers go away in 1-2 weeks. This information is not intended to replace advice given to you by your health care provider. Make sure you discuss any questions you have with your health care provider. Document Released: 11/18/2004 Document Revised: 02/23/2018 Document Reviewed: 02/23/2018 Elsevier Patient Education  2020 [employer].

## 2019-04-25 ENCOUNTER — Telehealth: Payer: Self-pay | Admitting: Family Medicine

## 2019-04-25 DIAGNOSIS — E876 Hypokalemia: Secondary | ICD-10-CM

## 2019-04-25 DIAGNOSIS — E871 Hypo-osmolality and hyponatremia: Secondary | ICD-10-CM

## 2019-04-25 NOTE — Telephone Encounter (Signed)
LMTCB 04/25/2019  Thanks,   -Mickel Baas

## 2019-04-25 NOTE — Telephone Encounter (Signed)
Please advise patient is time to recheck labs due to low potassium and sodium from earlier this month. Please print order and leave at lab.

## 2019-04-30 NOTE — Telephone Encounter (Signed)
Tried calling patient. Left message to call back. 

## 2019-05-01 NOTE — Telephone Encounter (Signed)
Patient called and stated that she came in this morning to have her blood drawn. FYI

## 2019-05-02 ENCOUNTER — Other Ambulatory Visit: Payer: Self-pay | Admitting: Family Medicine

## 2019-05-02 ENCOUNTER — Telehealth: Payer: Self-pay

## 2019-05-02 LAB — RENAL FUNCTION PANEL
Albumin: 4.6 g/dL (ref 3.8–4.8)
BUN/Creatinine Ratio: 11 — ABNORMAL LOW (ref 12–28)
BUN: 10 mg/dL (ref 8–27)
CO2: 20 mmol/L (ref 20–29)
Calcium: 9.8 mg/dL (ref 8.7–10.3)
Chloride: 103 mmol/L (ref 96–106)
Creatinine, Ser: 0.95 mg/dL (ref 0.57–1.00)
GFR calc Af Amer: 72 mL/min/{1.73_m2} (ref >59)
GFR calc non Af Amer: 62 mL/min/{1.73_m2} (ref >59)
Glucose: 107 mg/dL — ABNORMAL HIGH (ref 65–99)
Phosphorus: 3.5 mg/dL (ref 3.0–4.3)
Potassium: 4.5 mmol/L (ref 3.5–5.2)
Sodium: 137 mmol/L (ref 134–144)

## 2019-05-02 LAB — TSH: TSH: 1.03 u[IU]/mL (ref 0.450–4.500)

## 2019-05-02 NOTE — Telephone Encounter (Signed)
Patient advised as directed below. 

## 2019-05-02 NOTE — Telephone Encounter (Signed)
Patient advised and agrees with treatment plan. Follow up appointment scheduled 08/06/2019 at 8am.

## 2019-05-02 NOTE — Telephone Encounter (Signed)
Try taking 500mg  magnesium every evening and drink 1 or 2 more glasses of water every day. Continue spironolactone.

## 2019-05-02 NOTE — Telephone Encounter (Signed)
Patient advised of results. She says she is tolerating the medication well for the most part, but has started having occasional cramps in her feet at night. She say this doesn't happen every night, but has happened enough for her to notice. Patient wants to know if this is a side effect of the medication? Since patient is having cramps in her feet, please advise on whether you would like for patient to proceed with plan of care as stated below in the initial message. Her home blood pressure readings have averaged around 134/82.   Pharmacy: [facility] [place]

## 2019-05-02 NOTE — Telephone Encounter (Signed)
Tried calling patient. Left message to call back. 

## 2019-05-02 NOTE — Telephone Encounter (Signed)
-----   Message from Birdie Sons, MD sent at 05/02/2019  7:36 AM EDT ----- Sodium and potassium levels are back to normal. Please see how she is doing with the spironolactone and if she has been able to keep a check on her BP. If tolerating medication, then can send in refill for 3 months and schedule follow up in about 3 months to check BP and electrolytes.

## 2019-05-02 NOTE — Telephone Encounter (Signed)
Patient says she was prescribed mouthwash to treat ulcers in her mouth. She has been using the mouth wash 3-4 times a day and most all the ulcers have resolved. She still has one ulcer left and would like to know if she could do warm salt water gargles or baking soda in between using the mouthwash? She also wants to know if there is a better toothpaste that you recommend that would help? Please advise. Patient contact [phone]

## 2019-05-02 NOTE — Telephone Encounter (Signed)
Yes she can use warm salt water gargles in between. As for toothpaste try to avoid any that have high amounts of sodium-lauryl sulfate.

## 2019-05-10 ENCOUNTER — Telehealth: Payer: Self-pay | Admitting: Family Medicine

## 2019-05-10 NOTE — Telephone Encounter (Signed)
Pt called wanting to ask questions regarding the magnesium since she isn't having cramps anymore  CB#  [phone]  teri

## 2019-05-10 NOTE — Telephone Encounter (Signed)
Is it okay for Ms. Jurewicz to stop taking magnesium?    Thanks,   -Mickel Baas

## 2019-05-10 NOTE — Telephone Encounter (Signed)
There is no other medical reason for her to take it. If it helps with cramps she can take it. If she stops and cramps return she can start taking it again.

## 2019-05-11 NOTE — Telephone Encounter (Signed)
Pt advised.   Thanks,   -Nabor Thomann  

## 2019-08-03 ENCOUNTER — Ambulatory Visit: Payer: Medicare Other | Admitting: Family Medicine

## 2019-08-03 ENCOUNTER — Other Ambulatory Visit: Payer: Self-pay

## 2019-08-03 ENCOUNTER — Encounter: Payer: Self-pay | Admitting: Family Medicine

## 2019-08-03 ENCOUNTER — Ambulatory Visit: Payer: Self-pay | Admitting: Family Medicine

## 2019-08-03 VITALS — BP 138/80 | HR 92 | Temp 96.9°F | Resp 16 | Wt 151.0 lb

## 2019-08-03 DIAGNOSIS — I10 Essential (primary) hypertension: Secondary | ICD-10-CM

## 2019-08-03 DIAGNOSIS — K120 Recurrent oral aphthae: Secondary | ICD-10-CM | POA: Diagnosis not present

## 2019-08-03 DIAGNOSIS — Z23 Encounter for immunization: Secondary | ICD-10-CM

## 2019-08-03 MED ORDER — FIRST-DUKES MOUTHWASH MT SUSP: 5.0000 mL | Freq: Four times a day (QID) | OROMUCOSAL | 5 refills | Status: DC | PRN

## 2019-08-03 NOTE — Patient Instructions (Addendum)
.   Please review the attached list of medications and notify my office if there are any errors.   . Please bring all of your medications to every appointment so we can make sure that our medication list is the same as yours.   Please go to the lab draw station in [address] on the second floor of [facility] . Normal hours are 8:00am to 12:30pm and 1:30pm to 4:00pm Monday through Friday

## 2019-08-03 NOTE — Progress Notes (Signed)
Patient: Dana Harper Female    DOB: [birth year]   68 y.o.   MRN: [id number] Visit Date: 08/03/2019  Today's Provider: Lelon Huh, MD   Chief Complaint  Patient presents with  . Hypertension   Subjective:     HPI  Hypertension, follow-up:  BP Readings from Last 3 Encounters:  08/03/19 (!) 142/80  04/23/19 135/79  04/09/19 140/80    She was last seen for hypertension 4 months ago.  BP at that visit was 140/80. Management since that visit includes stopping HCTZ due to low sodium levels. Patient was started on Spironolactone 25mg  daily. Labs were rechecked on 04/30/2019 and levels were back to normal.  She reports good compliance with treatment. She is not having side effects.  She is exercising. She is adherent to low salt diet.   Outside blood pressures are 135-140/ 85 . She is experiencing none.  Patient denies chest pain, chest pressure/discomfort, claudication, dyspnea, exertional chest pressure/discomfort, fatigue, irregular heart beat, lower extremity edema, near-syncope, orthopnea, palpitations, paroxysmal nocturnal dyspnea, syncope and tachypnea.   Cardiovascular risk factors include advanced age (older than 50 for men, 17 for women) and hypertension.  Use of agents associated with hypertension: NSAIDS.     Weight trend: fluctuating a bit Wt Readings from Last 3 Encounters:  08/03/19 151 lb (68.5 kg)  04/23/19 146 lb 12.8 oz (66.6 kg)  04/09/19 152 lb (68.9 kg)    Current diet: well balanced  ------------------------------------------------------------------------  Allergies  Allergen Reactions  . Demerol  [Meperidine]   . Codeine Rash     Current Outpatient Medications:  .  amLODipine-benazepril (LOTREL) 5-20 MG capsule, TAKE 1 CAPSULE BY MOUTH EVERY DAY, Disp: 90 capsule, Rfl: 4 .  aspirin 81 MG tablet, Take 81 mg by mouth daily. , Disp: , Rfl:  .  cholecalciferol (VITAMIN D3) 25 MCG (1000 UT) tablet, Take 1,000 Units by mouth daily.,  Disp: , Rfl:  .  Coenzyme Q10 (COQ10) 100 MG CAPS, Take 1 capsule by mouth daily., Disp: , Rfl:  .  lovastatin (MEVACOR) 20 MG tablet, TAKE 1 TABLET BY MOUTH EVERYDAY AT BEDTIME, Disp: 90 tablet, Rfl: 4 .  spironolactone (ALDACTONE) 25 MG tablet, TAKE 1 TABLET BY MOUTH EVERY DAY, Disp: 30 tablet, Rfl: 6 .  Diphenhyd-Hydrocort-Nystatin (FIRST-DUKES MOUTHWASH) SUSP, Use as directed 5 mLs in the mouth or throat 4 (four) times daily as needed. (Patient not taking: Reported on 08/03/2019), Disp: 237 mL, Rfl: 0  Review of Systems  Constitutional: Negative for appetite change, chills, fatigue and fever.  Respiratory: Negative for chest tightness and shortness of breath.   Cardiovascular: Negative for chest pain and palpitations.  Gastrointestinal: Negative for abdominal pain, nausea and vomiting.  Neurological: Negative for dizziness and weakness.    Social History   Tobacco Use  . Smoking status: Never Smoker  . Smokeless tobacco: Never Used  Substance Use Topics  . Alcohol use: No      Objective:    Vitals:   08/03/19 1511 08/03/19 1513 08/03/19 1524  BP: 140/74 (!) 142/80 138/80  Pulse: 92    Resp: 16    Temp: (!) 96.9 F (36.1 C)    TempSrc: Temporal    SpO2: 98%    Weight: 151 lb (68.5 kg)    Body mass index is 27.62 kg/m.   Physical Exam  General appearance: Well developed, well nourished female, cooperative and in no acute distress Head: Normocephalic, without obvious abnormality, atraumatic Respiratory: Respirations even and  unlabored, normal respiratory rate Extremities: All extremities are intact.  Skin: Skin color, texture, turgor normal. No rashes seen  Psych: Appropriate mood and affect. Neurologic: Mental status: Alert, oriented to person, place, and time, thought content appropriate.       Assessment & Plan    1. Benign essential HTN  - Renal function panel  2. Need for influenza vaccination  - Flu Vaccine QUAD High Dose(Fluad)  3. Aphthous ulcer   - Diphenhyd-Hydrocort-Nystatin (FIRST-DUKES MOUTHWASH) SUSP; Use as directed 5 mLs in the mouth or throat 4 (four) times daily as needed.  Dispense: 237 mL; Refill: 5     Lelon Huh, MD  [facility]

## 2019-08-04 LAB — RENAL FUNCTION PANEL
Albumin: 4.8 g/dL (ref 3.8–4.8)
BUN/Creatinine Ratio: 16 (ref 12–28)
BUN: 16 mg/dL (ref 8–27)
CO2: 22 mmol/L (ref 20–29)
Calcium: 9.9 mg/dL (ref 8.7–10.3)
Chloride: 104 mmol/L (ref 96–106)
Creatinine, Ser: 0.98 mg/dL (ref 0.57–1.00)
GFR calc Af Amer: 69 mL/min/{1.73_m2} (ref >59)
GFR calc non Af Amer: 59 mL/min/{1.73_m2} — ABNORMAL LOW (ref >59)
Glucose: 91 mg/dL (ref 65–99)
Phosphorus: 3.5 mg/dL (ref 3.0–4.3)
Potassium: 4.4 mmol/L (ref 3.5–5.2)
Sodium: 139 mmol/L (ref 134–144)

## 2019-08-06 ENCOUNTER — Ambulatory Visit: Payer: Self-pay | Admitting: Family Medicine

## 2019-08-27 NOTE — Progress Notes (Signed)
       Patient: Dana Harper Female    DOB: 12-01-1950   68 y.o.   MRN: [id number] Visit Date: 08/28/2019  Today's Provider: Lelon Huh, MD   Chief Complaint  Patient presents with  . Mouth sore   Subjective:     HPI Tongue lesion: Patient presents for a follow up. Last OV was on 08/03/2019. Patient advised to started [facility] magic mouthwash. Patient reports good compliance with treatment plan. She used the mouthwash for a week. She states symptoms are unchanged. She also tried warm salt water, and baking soda with no relief. She also states she has been under a lot of stress due to illness of her husband, and had been on daily multivitamin in the past which she no longer takes.    Allergies  Allergen Reactions  . Demerol  [Meperidine]   . Codeine Rash     Current Outpatient Medications:  .  amLODipine-benazepril (LOTREL) 5-20 MG capsule, TAKE 1 CAPSULE BY MOUTH EVERY DAY, Disp: 90 capsule, Rfl: 4 .  aspirin 81 MG tablet, Take 81 mg by mouth daily. , Disp: , Rfl:  .  cholecalciferol (VITAMIN D3) 25 MCG (1000 UT) tablet, Take 1,000 Units by mouth daily., Disp: , Rfl:  .  Coenzyme Q10 (COQ10) 100 MG CAPS, Take 1 capsule by mouth daily., Disp: , Rfl:  .  Diphenhyd-Hydrocort-Nystatin (FIRST-DUKES MOUTHWASH) SUSP, Use as directed 5 mLs in the mouth or throat 4 (four) times daily as needed., Disp: 237 mL, Rfl: 5 .  lovastatin (MEVACOR) 20 MG tablet, TAKE 1 TABLET BY MOUTH EVERYDAY AT BEDTIME, Disp: 90 tablet, Rfl: 4 .  spironolactone (ALDACTONE) 25 MG tablet, TAKE 1 TABLET BY MOUTH EVERY DAY, Disp: 30 tablet, Rfl: 6  Review of Systems  Constitutional: Negative.   HENT: Mouth sores: ulcer.   Respiratory: Negative.   Cardiovascular: Negative.     Social History   Tobacco Use  . Smoking status: Never Smoker  . Smokeless tobacco: Never Used  Substance Use Topics  . Alcohol use: No      Objective:   BP 134/79 (BP Location: Right Arm, Patient Position: Sitting, Cuff  Size: Normal)   Pulse 85   Temp (!) 96.9 F (36.1 C) (Temporal)   Wt 150 lb 9.6 oz (68.3 kg)   SpO2 97%   BMI 27.55 kg/m  Vitals:   08/28/19 0828  BP: 134/79  Pulse: 85  Temp: (!) 96.9 F (36.1 C)  TempSrc: Temporal  SpO2: 97%  Weight: 150 lb 9.6 oz (68.3 kg)  Body mass index is 27.55 kg/m.   Physical Exam  General appearance: Well developed, well nourished female, cooperative and in no acute distress Head: Normocephalic, without obvious abnormality, atraumatic Oral: Slightly nodule pale lesion left side of  Distal tongue. No erythema. Not ulcerated.      Assessment & Plan    1. Nodule of tongue Initially appeared to be aphthous ulcer, but now has more nodular appearance. No improvement with Magic Mouthwash. Recommend she have evaluated by ENT. Her husband has some appointments next week, so she want to hold off on ENT evaluation until afterword.      Lelon Huh, MD  [facility]

## 2019-08-28 ENCOUNTER — Encounter: Payer: Self-pay | Admitting: Family Medicine

## 2019-08-28 ENCOUNTER — Other Ambulatory Visit: Payer: Self-pay

## 2019-08-28 ENCOUNTER — Ambulatory Visit: Payer: Medicare Other | Admitting: Family Medicine

## 2019-08-28 VITALS — BP 134/79 | HR 85 | Temp 96.9°F | Wt 150.6 lb

## 2019-08-28 DIAGNOSIS — R220 Localized swelling, mass and lump, head: Secondary | ICD-10-CM | POA: Diagnosis not present

## 2019-08-28 NOTE — Patient Instructions (Addendum)
.   Please review the attached list of medications and notify my office if there are any errors.   . Start taking OTC Lysine 1gram three time a day to see if will help the sore on your tongue heal  . Start back on daily Centrum Silver every morning   Start taking the lovastatin and CoQ10 at night instead of the morning   Continue taking the aspirin and amlodipine at night

## 2019-08-29 ENCOUNTER — Telehealth: Payer: Self-pay | Admitting: Family Medicine

## 2019-08-29 NOTE — Telephone Encounter (Signed)
Yes, she can take it with meals.

## 2019-08-29 NOTE — Telephone Encounter (Signed)
Pt wants to know if she needs to take the Vitamin D3 with MultiVitamin.  Please call pt back at [phone].  Thanks, [employer]

## 2019-08-29 NOTE — Telephone Encounter (Signed)
Yes she needs to take the vitamin d and daily multivitamin

## 2019-08-29 NOTE — Telephone Encounter (Signed)
Patient advised.

## 2019-08-29 NOTE — Telephone Encounter (Signed)
Patient advised and verbally voiced understanding. She then says she has another question.   Patient states she was told to start OTC  Lysine 3 times a day. She says the directions of the box says she should take it preferably on an empty stomach. Patient wants to know if it is ok to take with meals since she has to take it 3 times a day which would be about the same time she eats breakfast, lunch and dinner.

## 2019-08-29 NOTE — Telephone Encounter (Signed)
Please review. I see where labs that were done on 12/21/2017 showed low  Vitamin D level. Patient was supposed to take OTC vitamin D3 1000 units a day, in addition to a daily women's multivitamin. Is this still the same recommendation, or has it changed?

## 2019-08-31 NOTE — Progress Notes (Signed)
       Patient: Dana Harper Female    DOB: 04-06-1951   68 y.o.   MRN: [id number] Visit Date: 09/03/2019  Today's Provider: Lelon Huh, MD   Chief Complaint  Patient presents with  . Follow-up   Subjective:     HPI Lesion of Tongue: Patient come in today wanting to discuss stopping Spironolactone, She believes this may be the cause of her nodule on tongue. She states sore started about 3 months after starting spironolactone, but her BP has been very good recently She did start taking Lysine TID since last visit and states sore on tongue is improving.  She also reports sores right upper gum the last week ago  She also wants to know if she can take any OTC allergy medications since she has had some clear drainage and congestion.  Allergies  Allergen Reactions  . Demerol  [Meperidine]   . Codeine Rash     Current Outpatient Medications:  .  amLODipine-benazepril (LOTREL) 5-20 MG capsule, TAKE 1 CAPSULE BY MOUTH EVERY DAY, Disp: 90 capsule, Rfl: 4 .  aspirin 81 MG tablet, Take 81 mg by mouth daily. , Disp: , Rfl:  .  cholecalciferol (VITAMIN D3) 25 MCG (1000 UT) tablet, Take 1,000 Units by mouth daily., Disp: , Rfl:  .  Coenzyme Q10 (COQ10) 100 MG CAPS, Take 1 capsule by mouth daily., Disp: , Rfl:  .  Diphenhyd-Hydrocort-Nystatin (FIRST-DUKES MOUTHWASH) SUSP, Use as directed 5 mLs in the mouth or throat 4 (four) times daily as needed., Disp: 237 mL, Rfl: 5 .  lovastatin (MEVACOR) 20 MG tablet, TAKE 1 TABLET BY MOUTH EVERYDAY AT BEDTIME, Disp: 90 tablet, Rfl: 4 .  spironolactone (ALDACTONE) 25 MG tablet, TAKE 1 TABLET BY MOUTH EVERY DAY, Disp: 30 tablet, Rfl: 6  Lysine 1000mg  three times daily  Review of Systems  Constitutional: Negative.   Respiratory: Negative.   Cardiovascular: Negative.   Musculoskeletal: Negative.     Social History   Tobacco Use  . Smoking status: Never Smoker  . Smokeless tobacco: Never Used  Substance Use Topics  . Alcohol use: No      Objective:   BP 120/80 (BP Location: Left Arm, Patient Position: Sitting, Cuff Size: Normal)   Pulse 95   Temp (!) 97.3 F (36.3 C) (Temporal)   Resp 16   Wt 150 lb (68 kg)   SpO2 99% Comment: room air  BMI 27.44 kg/m  Vitals:   09/03/19 0811  BP: 120/80  Pulse: 95  Resp: 16  Temp: (!) 97.3 F (36.3 C)  TempSrc: Temporal  SpO2: 99%  Weight: 150 lb (68 kg)  Body mass index is 27.44 kg/m.   Physical Exam  Oral: Tongue lesion noted last visit is nearly resolved, however now shallow ulceration note just superior to right upper conines. Slightly erythematous.       Assessment & Plan    1. Seasonal allergic rhinitis due to pollen Advised can take OTC loratadine per labile.   2. Aphthous ulcer Lesion on tongue improved, but now lesion on right upper gingiva. Encouraged warm salt water gargles, continue Lysine. Call if symptoms change or if not rapidly improving.         Lelon Huh, MD  [facility]

## 2019-09-03 ENCOUNTER — Other Ambulatory Visit: Payer: Self-pay

## 2019-09-03 ENCOUNTER — Ambulatory Visit: Payer: Medicare Other | Admitting: Family Medicine

## 2019-09-03 ENCOUNTER — Encounter: Payer: Self-pay | Admitting: Family Medicine

## 2019-09-03 VITALS — BP 120/80 | HR 95 | Temp 97.3°F | Resp 16 | Wt 150.0 lb

## 2019-09-03 DIAGNOSIS — J301 Allergic rhinitis due to pollen: Secondary | ICD-10-CM | POA: Diagnosis not present

## 2019-09-03 DIAGNOSIS — K120 Recurrent oral aphthae: Secondary | ICD-10-CM

## 2019-09-03 MED ORDER — LORATADINE 10 MG PO TABS: 10.0000 mg | ORAL_TABLET | Freq: Every day | ORAL | Status: DC

## 2019-12-27 ENCOUNTER — Ambulatory Visit: Payer: Self-pay

## 2019-12-28 ENCOUNTER — Ambulatory Visit: Payer: Medicare Other

## 2019-12-28 ENCOUNTER — Encounter: Payer: Medicare Other | Admitting: Family Medicine

## 2020-02-05 DIAGNOSIS — Z1231 Encounter for screening mammogram for malignant neoplasm of breast: Secondary | ICD-10-CM | POA: Diagnosis not present

## 2020-02-20 DIAGNOSIS — R928 Other abnormal and inconclusive findings on diagnostic imaging of breast: Secondary | ICD-10-CM | POA: Diagnosis not present

## 2020-02-20 LAB — HM MAMMOGRAPHY

## 2020-02-21 ENCOUNTER — Other Ambulatory Visit: Payer: Self-pay | Admitting: Family Medicine

## 2020-03-11 ENCOUNTER — Ambulatory Visit (INDEPENDENT_AMBULATORY_CARE_PROVIDER_SITE_OTHER): Payer: Medicare PPO

## 2020-03-11 ENCOUNTER — Other Ambulatory Visit: Payer: Self-pay

## 2020-03-11 DIAGNOSIS — Z0000 Encounter for general adult medical examination without abnormal findings: Secondary | ICD-10-CM | POA: Diagnosis not present

## 2020-03-11 DIAGNOSIS — E2839 Other primary ovarian failure: Secondary | ICD-10-CM

## 2020-03-11 NOTE — Progress Notes (Signed)
I,Roshena L Chambers,acting as a scribe for Lelon Huh, MD.,have documented all relevant documentation on the behalf of Lelon Huh, MD,as directed by  Lelon Huh, MD while in the presence of Lelon Huh, MD.  Complete physical exam   Patient: Dana Harper   DOB: November 09, 1950   69 y.o. Female  MRN: [id number] Visit Date: 03/12/2020  Today's healthcare provider: Lelon Huh, MD   Chief Complaint  Patient presents with  . Annual Exam  . Hypertension  . Vitamin D Deficiency  . Osteopenia   Subjective    Dana Harper is a 69 y.o. female who presents today for a complete physical exam.  She reports consuming a general diet. Home exercise routine includes walking 30 minutes daily. She generally feels fairly well. She reports sleeping fairly well. She does have additional problems to discuss today.  HPI   Had AWV with [facility] on 03/11/2020  Hypertension, follow-up  BP Readings from Last 3 Encounters:  03/12/20 (!) 152/90  09/03/19 120/80  08/28/19 134/79   Wt Readings from Last 3 Encounters:  03/12/20 154 lb (69.9 kg)  09/03/19 150 lb (68 kg)  08/28/19 150 lb 9.6 oz (68.3 kg)     She was last seen for hypertension 7 months ago.  BP at that visit was 138/80. Management since that visit includes continuing same medications.  She reports good compliance with treatment. She is not having side effects.  She is following a Regular diet. She is exercising. She does not smoke.  Use of agents associated with hypertension: NSAIDS.   Outside blood pressures are 128-140/80. Symptoms: No chest pain No chest pressure  No palpitations No syncope  No dyspnea No orthopnea  No paroxysmal nocturnal dyspnea No lower extremity edema   Pertinent labs: Lab Results  Component Value Date   CHOL 180 12/21/2018   HDL 55 12/21/2018   LDLCALC 97 12/21/2018   TRIG 139 12/21/2018   CHOLHDL 3.3 12/21/2018   Lab Results  Component Value Date   NA 139 08/03/2019   K  4.4 08/03/2019   CREATININE 0.98 08/03/2019   GFRNONAA 59 (L) 08/03/2019   GFRAA 69 08/03/2019   GLUCOSE 91 08/03/2019     The 10-year ASCVD risk score Mikey Bussing DC Jr., et al., 2013) is: 13.6%   --------------------------------------------------------------------------------------------------- Follow up for Osteopenia:  The patient was last seen for this 1 years ago. Changes made at last visit include none.  She reports good compliance with treatment. She feels that condition is Unchanged. She is not having side effects.   -----------------------------------------------------------------------------------------  Follow up for Vitamin D Deficiency:  The patient was last seen for this 1 years ago. Changes made at last visit include none.  She reports good compliance with treatment. She feels that condition is Unchanged. She is not having side effects.   -----------------------------------------------------------------------------------------  Lipid/Cholesterol, Follow-up  Last lipid panel Other pertinent labs  Lab Results  Component Value Date   CHOL 180 12/21/2018   HDL 55 12/21/2018   LDLCALC 97 12/21/2018   TRIG 139 12/21/2018   CHOLHDL 3.3 12/21/2018   Lab Results  Component Value Date   ALT 20 12/21/2018   AST 23 12/21/2018   PLT 218 05/20/2014   TSH 1.030 05/01/2019     She was last seen for this 1 years ago.  Management since that visit includes continue same medications.  She reports good compliance with treatment. She is not having side effects.   Symptoms: No chest pain  No chest pressure/discomfort  No dyspnea No lower extremity edema  No numbness or tingling of extremity No orthopnea  No palpitations No paroxysmal nocturnal dyspnea  No speech difficulty No syncope   Current diet: well balanced Current exercise: walking  The 10-year ASCVD risk score Mikey Bussing DC Jr., et al., 2013) is:  13.6%  ---------------------------------------------------------------------------------------------------  Oral pain: Patient complains of an occasional burning sensation in her mouth. Symptoms started a few months ago and occur mostly in the evenings. She has tried using Mylanta and Benadryl to help with symptoms.    Past Medical History:  Diagnosis Date  . History of pancreatitis   . Lichen sclerosus    Past Surgical History:  Procedure Laterality Date  . ABDOMINAL HYSTERECTOMY  1979   vaginal: partial  hysterectomy. No history of cancerous or precancerous conditions  . COLONOSCOPY WITH PROPOFOL N/A 12/02/2015   Procedure: COLONOSCOPY WITH PROPOFOL;  Surgeon: Lucilla Lame, MD;  Location: [facility] ENDOSCOPY;  Service: Endoscopy;  Laterality: N/A;  . mammogram screening  08/20/2013   Bi rads cat 4  . pap smear  2009   Dr. Bayard Males [place]  . stress echocardiogram  08/31/2013   changes consistent with apical ischemia   Social History   Socioeconomic History  . Marital status: Married    Spouse name: Not on file  . Number of children: 2  . Years of education: Not on file  . Highest education level: Some college, no degree  Occupational History  . Occupation: Retired  Tobacco Use  . Smoking status: Never Smoker  . Smokeless tobacco: Never Used  Substance and Sexual Activity  . Alcohol use: No  . Drug use: No  . Sexual activity: Not on file  Other Topics Concern  . Not on file  Social History Narrative  . Not on file   Social Determinants of Health   Financial Resource Strain: Low Risk   . Difficulty of Paying Living Expenses: Not hard at all  Food Insecurity: No Food Insecurity  . Worried About Charity fundraiser in the Last Year: Never true  . Ran Out of Food in the Last Year: Never true  Transportation Needs: No Transportation Needs  . Lack of Transportation (Medical): No  . Lack of Transportation (Non-Medical): No  Physical Activity: Inactive  . Days of Exercise  per Week: 0 days  . Minutes of Exercise per Session: 0 min  Stress: No Stress Concern Present  . Feeling of Stress : Not at all  Social Connections: Somewhat Isolated  . Frequency of Communication with Friends and Family: More than three times a week  . Frequency of Social Gatherings with Friends and Family: More than three times a week  . Attends Religious Services: Never  . Active Member of Clubs or Organizations: No  . Attends Archivist Meetings: Never  . Marital Status: Married  Human resources officer Violence: Not At Risk  . Fear of Current or Ex-Partner: No  . Emotionally Abused: No  . Physically Abused: No  . Sexually Abused: No   Family Status  Relation Name Status  . Father  Deceased at age 69  . Sister  Alive  . Mother  Deceased at age 1       Sepsis   Family History  Problem Relation Age of Onset  . Cancer Father        stomach cancer  . Cancer Sister        breast cancer  . Colon cancer Sister  Allergies  Allergen Reactions  . Demerol  [Meperidine]   . Codeine Rash    Patient Care Team: Birdie Sons, MD as PCP - General (Family Medicine)   Medications: Outpatient Medications Prior to Visit  Medication Sig  . amLODipine-benazepril (LOTREL) 5-20 MG capsule TAKE 1 CAPSULE BY MOUTH EVERY DAY  . aspirin 81 MG tablet Take 81 mg by mouth daily.   . cholecalciferol (VITAMIN D3) 25 MCG (1000 UT) tablet Take 1,000 Units by mouth daily.  . Coenzyme Q10 (COQ10) 100 MG CAPS Take 1 capsule by mouth daily.  . Diphenhyd-Hydrocort-Nystatin (FIRST-DUKES MOUTHWASH) SUSP Use as directed 5 mLs in the mouth or throat 4 (four) times daily as needed.  . loratadine (CLARITIN) 10 MG tablet Take 1 tablet (10 mg total) by mouth daily. (Patient taking differently: Take 10 mg by mouth daily as needed. )  . lovastatin (MEVACOR) 20 MG tablet TAKE 1 TABLET BY MOUTH EVERYDAY AT BEDTIME  . Lysine 1000 MG TABS Take by mouth. One tablet 1-3 times daily  . spironolactone  (ALDACTONE) 25 MG tablet TAKE 1 TABLET BY MOUTH EVERY DAY (Patient not taking: Reported on 03/11/2020)   No facility-administered medications prior to visit.    Review of Systems  Constitutional: Negative for chills, fatigue and fever.  HENT: Negative for congestion, ear pain, rhinorrhea, sneezing and sore throat.        Burning sensation in mouth occasionally   Eyes: Negative.  Negative for pain and redness.  Respiratory: Negative for cough, shortness of breath and wheezing.   Cardiovascular: Negative for chest pain and leg swelling.  Gastrointestinal: Negative for abdominal pain, blood in stool, constipation, diarrhea and nausea.  Endocrine: Negative for polydipsia and polyphagia.  Genitourinary: Negative.  Negative for dysuria, flank pain, hematuria, pelvic pain, vaginal bleeding and vaginal discharge.  Musculoskeletal: Negative for arthralgias, back pain, gait problem and joint swelling.  Skin: Negative for rash.  Neurological: Negative.  Negative for dizziness, tremors, seizures, weakness, light-headedness, numbness and headaches.  Hematological: Negative for adenopathy.  Psychiatric/Behavioral: Negative.  Negative for behavioral problems, confusion and dysphoric mood. The patient is not nervous/anxious and is not hyperactive.       Objective    BP (!) 152/90 (BP Location: Right Arm, Cuff Size: Large)   Pulse 88   Temp (!) 97.1 F (36.2 C) (Temporal)   Resp 16   Ht 5\' 2"  (1.575 m)   Wt 154 lb (69.9 kg)   SpO2 99% Comment: room air  BMI 28.17 kg/m    Physical Exam   General Appearance:     Overweight female. Alert, cooperative, in no acute distress, appears stated age   Head:    Normocephalic, without obvious abnormality, atraumatic  Eyes:    PERRL, conjunctiva/corneas clear, EOM's intact, fundi    benign, both eyes  Ears:    Normal TM's and external ear canals, both ears  Nose:   Nares normal, septum midline, mucosa normal, no drainage    or sinus tenderness   Throat:   Lips, mucosa, and tongue normal; teeth and gums normal  Neck:   Supple, symmetrical, trachea midline, no adenopathy;    thyroid:  no enlargement/tenderness/nodules; no carotid   bruit or JVD  Back:     Symmetric, no curvature, ROM normal, no CVA tenderness  Lungs:     Clear to auscultation bilaterally, respirations unlabored  Chest Wall:    No tenderness or deformity   Heart:    Normal heart rate. Normal rhythm. No murmurs,  rubs, or gallops.   Breast Exam:    normal appearance, no masses or tenderness  Abdomen:     Soft, non-tender, bowel sounds active all four quadrants,    no masses, no organomegaly  Pelvic:    deferred  Extremities:   All extremities are intact. No cyanosis or edema  Pulses:   2+ and symmetric all extremities  Skin:   Skin color, texture, turgor normal, no rashes or lesions  Lymph nodes:   Cervical, supraclavicular, and axillary nodes normal  Neurologic:   CNII-XII intact, normal strength, sensation and reflexes    throughout    Depression Screen  PHQ 2/9 Scores 03/11/2020 12/21/2018 12/20/2017  PHQ - 2 Score 0 0 0  PHQ- 9 Score - 0 0    No results found for any visits on 03/12/20.  Assessment & Plan    Routine Health Maintenance and Physical Exam  Exercise Activities and Dietary recommendations Goals    . Weight (lb) < 140 lb (63.5 kg)     Starting 12/17/16, I will work to lose 25 pounds this year.   12/20/17, recommend to continue diet and exercise to aid in weight loss goal.        Immunization History  Administered Date(s) Administered  . Fluad Quad(high Dose 65+) 08/03/2019  . Influenza Split 10/22/2010  . Influenza, High Dose Seasonal PF 09/05/2018  . Influenza,inj,Quad PF,6+ Mos 08/14/2013, 10/30/2015  . Influenza-Unspecified 08/13/2016, 08/12/2017  . Moderna SARS-COVID-2 Vaccination 11/20/2019, 12/18/2019  . Pneumococcal Conjugate-13 12/20/2017  . Pneumococcal Polysaccharide-23 12/21/2018  . Tdap 05/29/2008  . Zoster 07/10/2014     Health Maintenance  Topic Date Due  . DEXA SCAN  01/01/2020  . TETANUS/TDAP  03/11/2021 (Originally 05/29/2018)  . INFLUENZA VACCINE  05/25/2020  . COLONOSCOPY  12/01/2020  . MAMMOGRAM  02/05/2021  . COVID-19 Vaccine  Completed  . Hepatitis C Screening  Completed  . PNA vac Low Risk Adult  Completed    Discussed health benefits of physical activity, and encouraged her to engage in regular exercise appropriate for her age and condition.  1. Annual physical exam Doing well.   2. Vitamin D deficiency Continue current vitamin d supplementation.  - VITAMIN D 25 Hydroxy (Vit-D Deficiency, Fractures)  3. Osteopenia of multiple sites Due for BMD in 2022  4. Benign essential HTN Up since stopping spironolactone due to mouth sores, but they have not really changed. She is going to start back on spironolactone.  - EKG 12-Lead  5. Hypercholesteremia She is tolerating lovastatin well with no adverse effects.   - Comprehensive metabolic panel - Lipid panel - TSH - CBC  6. Glossitis Improved using OTC benadryl and Mylanta.  - Vitamin B12   Return in about 3 months (around 06/12/2020).     The entirety of the information documented in the History of Present Illness, Review of Systems and Physical Exam were personally obtained by me. Portions of this information were initially documented by the CMA and reviewed by me for thoroughness and accuracy.      Lelon Huh, MD  [facility] [phone] (phone) [phone] (fax)  [place]

## 2020-03-11 NOTE — Patient Instructions (Signed)
[name] , Thank you for taking time to come for your Medicare Wellness Visit. I appreciate your ongoing commitment to your health goals. Please review the following plan we discussed and let me know if I can assist you in the future.   Screening recommendations/referrals: Colonoscopy: Up to date, due 11/2020 Mammogram: Up to date, due 02/05/21 Bone Density: Ordered today. Pt aware the office will call re: appt. Recommended yearly ophthalmology/optometry visit for glaucoma screening and checkup Recommended yearly dental visit for hygiene and checkup  Vaccinations: Influenza vaccine: Up to date Pneumococcal vaccine: Completed series Tdap vaccine: Pt declines today.  Shingles vaccine: Pt declines today.     Advanced directives: Advance directive discussed with you today. Even though you declined this today please call our office should you change your mind and we can give you the proper paperwork for you to fill out.  Conditions/risks identified: Continue current diet plan of eating healthy and exercising regularly to help loose weight.   Next appointment: 03/12/20 @ 9:00 AM with Dr Caryn Section    Preventive Care 69 Years and Older, Female Preventive care refers to lifestyle choices and visits with your health care provider that can promote health and wellness. What does preventive care include?  A yearly physical exam. This is also called an annual well check.  Dental exams once or twice a year.  Routine eye exams. Ask your health care provider how often you should have your eyes checked.  Personal lifestyle choices, including:  Daily care of your teeth and gums.  Regular physical activity.  Eating a healthy diet.  Avoiding tobacco and drug use.  Limiting alcohol use.  Practicing safe sex.  Taking low-dose aspirin every day.  Taking vitamin and mineral supplements as recommended by your health care provider. What happens during an annual well check? The services and  screenings done by your health care provider during your annual well check will depend on your age, overall health, lifestyle risk factors, and family history of disease. Counseling  Your health care provider may ask you questions about your:  Alcohol use.  Tobacco use.  Drug use.  Emotional well-being.  Home and relationship well-being.  Sexual activity.  Eating habits.  History of falls.  Memory and ability to understand (cognition).  Work and work Statistician.  Reproductive health. Screening  You may have the following tests or measurements:  Height, weight, and BMI.  Blood pressure.  Lipid and cholesterol levels. These may be checked every 5 years, or more frequently if you are over 47 years old.  Skin check.  Lung cancer screening. You may have this screening every year starting at age 69 if you have a 30-pack-year history of smoking and currently smoke or have quit within the past 15 years.  Fecal occult blood test (FOBT) of the stool. You may have this test every year starting at age 69.  Flexible sigmoidoscopy or colonoscopy. You may have a sigmoidoscopy every 5 years or a colonoscopy every 10 years starting at age 69.  Hepatitis C blood test.  Hepatitis B blood test.  Sexually transmitted disease (STD) testing.  Diabetes screening. This is done by checking your blood sugar (glucose) after you have not eaten for a while (fasting). You may have this done every 1-3 years.  Bone density scan. This is done to screen for osteoporosis. You may have this done starting at age 69.  Mammogram. This may be done every 1-2 years. Talk to your health care provider about how often  you should have regular mammograms. Talk with your health care provider about your test results, treatment options, and if necessary, the need for more tests. Vaccines  Your health care provider may recommend certain vaccines, such as:  Influenza vaccine. This is recommended every  year.  Tetanus, diphtheria, and acellular pertussis (Tdap, Td) vaccine. You may need a Td booster every 10 years.  Zoster vaccine. You may need this after age 69.  Pneumococcal 13-valent conjugate (PCV13) vaccine. One dose is recommended after age 69.  Pneumococcal polysaccharide (PPSV23) vaccine. One dose is recommended after age 69. Talk to your health care provider about which screenings and vaccines you need and how often you need them. This information is not intended to replace advice given to you by your health care provider. Make sure you discuss any questions you have with your health care provider. Document Released: 11/07/2015 Document Revised: 06/30/2016 Document Reviewed: 08/12/2015 Elsevier Interactive Patient Education  2017 [place] Prevention in the Home Falls can cause injuries. They can happen to people of all ages. There are many things you can do to make your home safe and to help prevent falls. What can I do on the outside of my home?  Regularly fix the edges of walkways and driveways and fix any cracks.  Remove anything that might make you trip as you walk through a door, such as a raised step or threshold.  Trim any bushes or trees on the path to your home.  Use bright outdoor lighting.  Clear any walking paths of anything that might make someone trip, such as rocks or tools.  Regularly check to see if handrails are loose or broken. Make sure that both sides of any steps have handrails.  Any raised decks and porches should have guardrails on the edges.  Have any leaves, snow, or ice cleared regularly.  Use sand or salt on walking paths during winter.  Clean up any spills in your garage right away. This includes oil or grease spills. What can I do in the bathroom?  Use night lights.  Install grab bars by the toilet and in the tub and shower. Do not use towel bars as grab bars.  Use non-skid mats or decals in the tub or shower.  If you  need to sit down in the shower, use a plastic, non-slip stool.  Keep the floor dry. Clean up any water that spills on the floor as soon as it happens.  Remove soap buildup in the tub or shower regularly.  Attach bath mats securely with double-sided non-slip rug tape.  Do not have throw rugs and other things on the floor that can make you trip. What can I do in the bedroom?  Use night lights.  Make sure that you have a light by your bed that is easy to reach.  Do not use any sheets or blankets that are too big for your bed. They should not hang down onto the floor.  Have a firm chair that has side arms. You can use this for support while you get dressed.  Do not have throw rugs and other things on the floor that can make you trip. What can I do in the kitchen?  Clean up any spills right away.  Avoid walking on wet floors.  Keep items that you use a lot in easy-to-reach places.  If you need to reach something above you, use a strong step stool that has a grab bar.  Keep electrical cords  out of the way.  Do not use floor polish or wax that makes floors slippery. If you must use wax, use non-skid floor wax.  Do not have throw rugs and other things on the floor that can make you trip. What can I do with my stairs?  Do not leave any items on the stairs.  Make sure that there are handrails on both sides of the stairs and use them. Fix handrails that are broken or loose. Make sure that handrails are as long as the stairways.  Check any carpeting to make sure that it is firmly attached to the stairs. Fix any carpet that is loose or worn.  Avoid having throw rugs at the top or bottom of the stairs. If you do have throw rugs, attach them to the floor with carpet tape.  Make sure that you have a light switch at the top of the stairs and the bottom of the stairs. If you do not have them, ask someone to add them for you. What else can I do to help prevent falls?  Wear shoes  that:  Do not have high heels.  Have rubber bottoms.  Are comfortable and fit you well.  Are closed at the toe. Do not wear sandals.  If you use a stepladder:  Make sure that it is fully opened. Do not climb a closed stepladder.  Make sure that both sides of the stepladder are locked into place.  Ask someone to hold it for you, if possible.  Clearly mark and make sure that you can see:  Any grab bars or handrails.  First and last steps.  Where the edge of each step is.  Use tools that help you move around (mobility aids) if they are needed. These include:  Canes.  Walkers.  Scooters.  Crutches.  Turn on the lights when you go into a dark area. Replace any light bulbs as soon as they burn out.  Set up your furniture so you have a clear path. Avoid moving your furniture around.  If any of your floors are uneven, fix them.  If there are any pets around you, be aware of where they are.  Review your medicines with your doctor. Some medicines can make you feel dizzy. This can increase your chance of falling. Ask your doctor what other things that you can do to help prevent falls. This information is not intended to replace advice given to you by your health care provider. Make sure you discuss any questions you have with your health care provider. Document Released: 08/07/2009 Document Revised: 03/18/2016 Document Reviewed: 11/15/2014 Elsevier Interactive Patient Education  2017 [employer].

## 2020-03-11 NOTE — Progress Notes (Signed)
Subjective:   Dana Harper is a 69 y.o. female who presents for Medicare Annual (Subsequent) preventive examination.  I connected with Dana Harper today by telephone and verified that I am speaking with the correct person using two identifiers. Location patient: home Location provider: work Persons participating in the virtual visit: patient, provider.   I discussed the limitations, risks, security and privacy concerns of performing an evaluation and management service by telephone and the availability of in person appointments. I also discussed with the patient that there may be a patient responsible charge related to this service. The patient expressed understanding and verbally consented to this telephonic visit.    Interactive audio and video telecommunications were attempted between this provider and patient, however failed, due to patient having technical difficulties OR patient did not have access to video capability.  We continued and completed visit with audio only.  Review of Systems:  N/A  Cardiac Risk Factors include: advanced age (>70men, >54 women);hypertension;dyslipidemia     Objective:     Vitals: There were no vitals taken for this visit.  There is no height or weight on file to calculate BMI.  Advanced Directives 03/11/2020 12/20/2017 12/17/2016 12/02/2015  Does Patient Have a Medical Advance Directive? No No No No  Would patient like information on creating a medical advance directive? No - Patient declined No - Patient declined No - Patient declined No - patient declined information    Tobacco Social History   Tobacco Use  Smoking Status Never Smoker  Smokeless Tobacco Never Used     Counseling given: Not Answered   Clinical Intake:  Pre-visit preparation completed: Yes  Pain : No/denies pain Pain Score: 0-No pain     Nutritional Status: BMI 25 -29 Overweight Nutritional Risks: None Diabetes: No  How often do you need to have someone help  you when you read instructions, pamphlets, or other written materials from your doctor or pharmacy?: 1 - Never  Interpreter Needed?: No  Information entered by :: [facility], LPN  Past Medical History:  Diagnosis Date  . History of pancreatitis   . Lichen sclerosus    Past Surgical History:  Procedure Laterality Date  . ABDOMINAL HYSTERECTOMY  1979   vaginal: partial  hysterectomy. No history of cancerous or precancerous conditions  . COLONOSCOPY WITH PROPOFOL N/A 12/02/2015   Procedure: COLONOSCOPY WITH PROPOFOL;  Surgeon: Lucilla Lame, MD;  Location: [facility] ENDOSCOPY;  Service: Endoscopy;  Laterality: N/A;  . mammogram screening  08/20/2013   Bi rads cat 4  . pap smear  2009   Dr. Bayard Males [place] [facility]  . stress echocardiogram  08/31/2013   changes consistent with apical ischemia   Family History  Problem Relation Age of Onset  . Cancer Father        stomach cancer  . Cancer Sister        breast cancer  . Colon cancer Sister    Social History   Socioeconomic History  . Marital status: Married    Spouse name: Not on file  . Number of children: 2  . Years of education: Not on file  . Highest education level: Some college, no degree  Occupational History  . Occupation: Retired  Tobacco Use  . Smoking status: Never Smoker  . Smokeless tobacco: Never Used  Substance and Sexual Activity  . Alcohol use: No  . Drug use: No  . Sexual activity: Not on file  Other Topics Concern  . Not on file  Social History Narrative  .  Not on file   Social Determinants of Health   Financial Resource Strain: Low Risk   . Difficulty of Paying Living Expenses: Not hard at all  Food Insecurity: No Food Insecurity  . Worried About Charity fundraiser in the Last Year: Never true  . Ran Out of Food in the Last Year: Never true  Transportation Needs: No Transportation Needs  . Lack of Transportation (Medical): No  . Lack of Transportation (Non-Medical): No  Physical Activity: Inactive  . Days  of Exercise per Week: 0 days  . Minutes of Exercise per Session: 0 min  Stress: No Stress Concern Present  . Feeling of Stress : Not at all  Social Connections: Somewhat Isolated  . Frequency of Communication with Friends and Family: More than three times a week  . Frequency of Social Gatherings with Friends and Family: More than three times a week  . Attends Religious Services: Never  . Active Member of Clubs or Organizations: No  . Attends Archivist Meetings: Never  . Marital Status: Married    Outpatient Encounter Medications as of 03/11/2020  Medication Sig  . amLODipine-benazepril (LOTREL) 5-20 MG capsule TAKE 1 CAPSULE BY MOUTH EVERY DAY  . aspirin 81 MG tablet Take 81 mg by mouth daily.   . cholecalciferol (VITAMIN D3) 25 MCG (1000 UT) tablet Take 1,000 Units by mouth daily.  . Coenzyme Q10 (COQ10) 100 MG CAPS Take 1 capsule by mouth daily.  . Diphenhyd-Hydrocort-Nystatin (FIRST-DUKES MOUTHWASH) SUSP Use as directed 5 mLs in the mouth or throat 4 (four) times daily as needed.  . loratadine (CLARITIN) 10 MG tablet Take 1 tablet (10 mg total) by mouth daily. (Patient taking differently: Take 10 mg by mouth daily as needed. )  . lovastatin (MEVACOR) 20 MG tablet TAKE 1 TABLET BY MOUTH EVERYDAY AT BEDTIME  . Lysine 1000 MG TABS Take by mouth. One tablet 1-3 times daily  . spironolactone (ALDACTONE) 25 MG tablet TAKE 1 TABLET BY MOUTH EVERY DAY (Patient not taking: Reported on 03/11/2020)   No facility-administered encounter medications on file as of 03/11/2020.    Activities of Daily Living In your present state of health, do you have any difficulty performing the following activities: 03/11/2020  Hearing? N  Vision? N  Difficulty concentrating or making decisions? N  Walking or climbing stairs? N  Dressing or bathing? N  Doing errands, shopping? N  Preparing Food and eating ? N  Using the Toilet? N  In the past six months, have you accidently leaked urine? N  Do you  have problems with loss of bowel control? N  Managing your Medications? N  Managing your Finances? N  Housekeeping or managing your Housekeeping? N  Some recent data might be hidden    Patient Care Team: Birdie Sons, MD as PCP - General (Family Medicine)    Assessment:   This is a routine wellness examination for [place].  Exercise Activities and Dietary recommendations Current Exercise Habits: Home exercise routine, Type of exercise: walking, Time (Minutes): 30, Frequency (Times/Week): 5, Weekly Exercise (Minutes/Week): 150, Intensity: Mild, Exercise limited by: None identified  Goals    . Weight (lb) < 140 lb (63.5 kg)     Starting 12/17/16, I will work to lose 25 pounds this year.   12/20/17, recommend to continue diet and exercise to aid in weight loss goal.        Fall Risk: Fall Risk  03/11/2020 12/21/2018 12/20/2017 12/17/2016  Falls in the past  year? 0 0 No No  Number falls in past yr: 0 0 - -  Injury with Fall? 0 1 - -  Follow up - Falls evaluation completed - -    FALL RISK PREVENTION PERTAINING TO THE HOME:  Any stairs in or around the home? Yes  If so, are there any without handrails? No   Home free of loose throw rugs in walkways, pet beds, electrical cords, etc? Yes  Adequate lighting in your home to reduce risk of falls? Yes   ASSISTIVE DEVICES UTILIZED TO PREVENT FALLS:  Life alert? No  Use of a cane, walker or w/c? No  Grab bars in the bathroom? Yes Shower chair or bench in shower? No  Elevated toilet seat or a handicapped toilet? Yes    TIMED UP AND GO:  Was the test performed? No .    Depression Screen PHQ 2/9 Scores 03/11/2020 12/21/2018 12/20/2017 12/20/2017  PHQ - 2 Score 0 0 0 0  PHQ- 9 Score - 0 0 -     Cognitive Function     6CIT Screen 12/17/2016  What Year? 0 points  What month? 0 points  What time? 0 points  Count back from 20 0 points  Months in reverse 0 points  Repeat phrase 2 points  Total Score 2    Immunization  History  Administered Date(s) Administered  . Fluad Quad(high Dose 65+) 08/03/2019  . Influenza Split 10/22/2010  . Influenza, High Dose Seasonal PF 09/05/2018  . Influenza,inj,Quad PF,6+ Mos 08/14/2013, 10/30/2015  . Influenza-Unspecified 08/13/2016, 08/12/2017  . Moderna SARS-COVID-2 Vaccination 11/20/2019, 12/18/2019  . Pneumococcal Conjugate-13 12/20/2017  . Pneumococcal Polysaccharide-23 12/21/2018  . Tdap 05/29/2008  . Zoster 07/10/2014    Qualifies for Shingles Vaccine? Yes  Zostavax completed 07/10/14. Due for Shingrix. Pt has been advised to call insurance company to determine out of pocket expense. Advised may also receive vaccine at local pharmacy or Health Dept. Verbalized acceptance and understanding.  Tdap: Although this vaccine is not a covered service during a Wellness Exam, does the patient still wish to receive this vaccine today?  No . Advised may receive this vaccine at local pharmacy or Health Dept. Aware to provide a copy of the vaccination record if obtained from local pharmacy or Health Dept. Verbalized acceptance and understanding.  Flu Vaccine: Up to date  Pneumococcal Vaccine: Completed series  Screening Tests Health Maintenance  Topic Date Due  . DEXA SCAN  01/01/2020  . TETANUS/TDAP  03/11/2021 (Originally 05/29/2018)  . INFLUENZA VACCINE  05/25/2020  . COLONOSCOPY  12/01/2020  . MAMMOGRAM  02/05/2021  . COVID-19 Vaccine  Completed  . Hepatitis C Screening  Completed  . PNA vac Low Risk Adult  Completed    Cancer Screenings:  Colorectal Screening: Completed 12/02/15. Repeat every 5 years.   Mammogram: Completed 02/06/20. Repeat every year as advised.   Bone Density: Completed 12/31/13. Results reflect OSTEOPENIA. Repeat every 3 years. Ordered today. Pt aware the office will call re: appt.  Lung Cancer Screening: (Low Dose CT Chest recommended if Age 95-80 years, 30 pack-year currently smoking OR have quit w/in 15years.) does not qualify.   Additional  Screening:  Hepatitis C Screening: Up to date  Vision Screening: Recommended annual ophthalmology exams for early detection of glaucoma and other disorders of the eye.  Dental Screening: Recommended annual dental exams for proper oral hygiene  Community Resource Referral:  CRR required this visit? No     Plan:  I have personally reviewed and  addressed the Medicare Annual Wellness questionnaire and have noted the following in the patient's chart:  A. Medical and social history B. Use of alcohol, tobacco or illicit drugs  C. Current medications and supplements D. Functional ability and status E.  Nutritional status F.  Physical activity G. Advance directives H. List of other physicians I.  Hospitalizations, surgeries, and ER visits in previous 12 months J.  [place] such as hearing and vision if needed, cognitive and depression L. Referrals and appointments   In addition, I have reviewed and discussed with patient certain preventive protocols, quality metrics, and best practice recommendations. A written personalized care plan for preventive services as well as general preventive health recommendations were provided to patient. Nurse Health Advisor  Signed,    Brighid Koch [place], [place]  [id number] Nurse Health Advisor   Nurse Notes: None.

## 2020-03-12 ENCOUNTER — Encounter: Payer: Self-pay | Admitting: Family Medicine

## 2020-03-12 ENCOUNTER — Ambulatory Visit (INDEPENDENT_AMBULATORY_CARE_PROVIDER_SITE_OTHER): Payer: Medicare PPO | Admitting: Family Medicine

## 2020-03-12 VITALS — BP 152/90 | HR 88 | Temp 97.1°F | Resp 16 | Ht 62.0 in | Wt 154.0 lb

## 2020-03-12 DIAGNOSIS — K140 Glossitis: Secondary | ICD-10-CM

## 2020-03-12 DIAGNOSIS — Z0000 Encounter for general adult medical examination without abnormal findings: Secondary | ICD-10-CM | POA: Diagnosis not present

## 2020-03-12 DIAGNOSIS — M8589 Other specified disorders of bone density and structure, multiple sites: Secondary | ICD-10-CM

## 2020-03-12 DIAGNOSIS — E559 Vitamin D deficiency, unspecified: Secondary | ICD-10-CM | POA: Diagnosis not present

## 2020-03-12 DIAGNOSIS — I10 Essential (primary) hypertension: Secondary | ICD-10-CM | POA: Diagnosis not present

## 2020-03-12 DIAGNOSIS — E7800 Pure hypercholesterolemia, unspecified: Secondary | ICD-10-CM | POA: Diagnosis not present

## 2020-03-12 NOTE — Patient Instructions (Addendum)
.   Please review the attached list of medications and notify my office if there are any errors.   . Please bring all of your medications to every appointment so we can make sure that our medication list is the same as yours.    The CDC recommends two doses of Shingrix (the shingles vaccine) separated by 2 to 6 months for adults age 69 years and older. I recommend checking with your pharmacy plan regarding coverage for this vaccine.   . You are due for a Tdap (tetanus-diptheria-pertussis vaccine) which protects you from tetanus and whooping cough. Please check with your insurance plan or pharmacy regarding coverage for this vaccine.    

## 2020-03-13 LAB — LIPID PANEL
Chol/HDL Ratio: 3 ratio (ref 0.0–4.4)
Cholesterol, Total: 177 mg/dL (ref 100–199)
HDL: 60 mg/dL (ref >39)
LDL Chol Calc (NIH): 101 mg/dL — ABNORMAL HIGH (ref 0–99)
Triglycerides: 90 mg/dL (ref 0–149)
VLDL Cholesterol Cal: 16 mg/dL (ref 5–40)

## 2020-03-13 LAB — CBC
Hematocrit: 43.1 % (ref 34.0–46.6)
Hemoglobin: 14.4 g/dL (ref 11.1–15.9)
MCH: 30.6 pg (ref 26.6–33.0)
MCHC: 33.4 g/dL (ref 31.5–35.7)
MCV: 92 fL (ref 79–97)
Platelets: 275 10*3/uL (ref 150–450)
RBC: 4.71 x10E6/uL (ref 3.77–5.28)
RDW: 12.5 % (ref 11.7–15.4)
WBC: 8.1 10*3/uL (ref 3.4–10.8)

## 2020-03-13 LAB — COMPREHENSIVE METABOLIC PANEL
ALT: 14 IU/L (ref 0–32)
AST: 19 IU/L (ref 0–40)
Albumin/Globulin Ratio: 1.9 (ref 1.2–2.2)
Albumin: 4.5 g/dL (ref 3.8–4.8)
Alkaline Phosphatase: 92 IU/L (ref 48–121)
BUN/Creatinine Ratio: 18 (ref 12–28)
BUN: 16 mg/dL (ref 8–27)
Bilirubin Total: 1.3 mg/dL — ABNORMAL HIGH (ref 0.0–1.2)
CO2: 21 mmol/L (ref 20–29)
Calcium: 9.8 mg/dL (ref 8.7–10.3)
Chloride: 102 mmol/L (ref 96–106)
Creatinine, Ser: 0.9 mg/dL (ref 0.57–1.00)
GFR calc Af Amer: 76 mL/min/{1.73_m2} (ref >59)
GFR calc non Af Amer: 66 mL/min/{1.73_m2} (ref >59)
Globulin, Total: 2.4 g/dL (ref 1.5–4.5)
Glucose: 96 mg/dL (ref 65–99)
Potassium: 3.9 mmol/L (ref 3.5–5.2)
Sodium: 140 mmol/L (ref 134–144)
Total Protein: 6.9 g/dL (ref 6.0–8.5)

## 2020-03-13 LAB — TSH: TSH: 0.998 u[IU]/mL (ref 0.450–4.500)

## 2020-03-13 LAB — VITAMIN D 25 HYDROXY (VIT D DEFICIENCY, FRACTURES): Vit D, 25-Hydroxy: 28.7 ng/mL — ABNORMAL LOW (ref 30.0–100.0)

## 2020-03-13 LAB — VITAMIN B12: Vitamin B-12: 297 pg/mL (ref 232–1245)

## 2020-03-15 ENCOUNTER — Other Ambulatory Visit: Payer: Self-pay | Admitting: Family Medicine

## 2020-03-15 NOTE — Telephone Encounter (Signed)
Just had CPE Requested Prescriptions  Pending Prescriptions Disp Refills  . amLODipine-benazepril (LOTREL) 5-20 MG capsule [Pharmacy Med Name: AMLODIPINE-BENAZEPRIL 5-20 MG] 30 capsule 0    Sig: TAKE 1 CAPSULE BY MOUTH EVERY DAY     Cardiovascular: CCB + ACEI Combos Failed - 03/15/2020 10:30 AM      Failed - Last BP in normal range    BP Readings from Last 1 Encounters:  03/12/20 (!) 152/90         Failed - Valid encounter within last 6 months    Recent Outpatient Visits          3 days ago Annual physical exam   [facility] Birdie Sons, MD   6 months ago Seasonal allergic rhinitis due to pollen   [place], Kirstie Peri, MD   6 months ago Nodule of tongue   [facility] Birdie Sons, MD   7 months ago Benign essential HTN   [facility] Birdie Sons, MD   10 months ago Aphthous ulcer   [place], Clearnce Sorrel, [place]      Future Appointments            In 3 months Fisher, Kirstie Peri, MD [facility]           Passed - Cr in normal range and within 180 days    Creatinine  Date Value Ref Range Status  12/11/2013 0.90 0.60 - 1.30 mg/dL Final   Creatinine, Ser  Date Value Ref Range Status  03/12/2020 0.90 0.57 - 1.00 mg/dL Final         Passed - K in normal range and within 180 days    Potassium  Date Value Ref Range Status  03/12/2020 3.9 3.5 - 5.2 mmol/L Final  12/11/2013 3.9 3.5 - 5.1 mmol/L Final         Passed - Patient is not pregnant

## 2020-03-17 NOTE — Progress Notes (Signed)
     I,Roshena L Chambers,acting as a scribe for Lelon Huh, MD.,have documented all relevant documentation on the behalf of Lelon Huh, MD,as directed by  Lelon Huh, MD while in the presence of Lelon Huh, MD.   Established patient visit   Patient: Dana Harper   DOB: 12-25-1950   69 y.o. Female  MRN: [id number] Visit Date: 03/18/2020  Today's healthcare provider: Lelon Huh, MD   Chief Complaint  Patient presents with  . Rash   Subjective    Rash This is a new problem. Episode onset: 1 month ago. The problem has been gradually worsening since onset. The affected locations include the face (on both sides of cheeks). The rash is characterized by redness. Pertinent negatives include no anorexia, congestion, cough, diarrhea, eye pain, facial edema, fatigue, fever, joint pain, nail changes, rhinorrhea, shortness of breath, sore throat or vomiting. Treatments tried: OTC jock itch cream. The treatment provided mild relief.        Medications: Outpatient Medications Prior to Visit  Medication Sig  . amLODipine-benazepril (LOTREL) 5-20 MG capsule TAKE 1 CAPSULE BY MOUTH EVERY DAY  . aspirin 81 MG tablet Take 81 mg by mouth daily.   . cholecalciferol (VITAMIN D3) 25 MCG (1000 UT) tablet Take 1,000 Units by mouth daily.  . Coenzyme Q10 (COQ10) 100 MG CAPS Take 1 capsule by mouth daily.  . Diphenhyd-Hydrocort-Nystatin (FIRST-DUKES MOUTHWASH) SUSP Use as directed 5 mLs in the mouth or throat 4 (four) times daily as needed.  . loratadine (CLARITIN) 10 MG tablet Take 1 tablet (10 mg total) by mouth daily. (Patient taking differently: Take 10 mg by mouth daily as needed. )  . lovastatin (MEVACOR) 20 MG tablet TAKE 1 TABLET BY MOUTH EVERYDAY AT BEDTIME  . Lysine 1000 MG TABS Take by mouth. One tablet 1-3 times daily  . spironolactone (ALDACTONE) 25 MG tablet TAKE 1 TABLET BY MOUTH EVERY DAY   No facility-administered medications prior to visit.    Review of Systems    Constitutional: Negative for fatigue and fever.  HENT: Negative for congestion, rhinorrhea and sore throat.   Eyes: Negative for pain.  Respiratory: Negative for cough and shortness of breath.   Gastrointestinal: Negative for anorexia, diarrhea and vomiting.  Musculoskeletal: Negative for joint pain.  Skin: Positive for rash. Negative for nail changes.     Objective    BP 140/82 (BP Location: Left Arm, Patient Position: Sitting, Cuff Size: Large)   Pulse 83   Temp (!) 97.3 F (36.3 C) (Temporal)   Resp 16   Wt 155 lb (70.3 kg)   SpO2 99% Comment: room air  BMI 28.35 kg/m    Physical Exam  Few follicular lesions on slightly erythematous base over malar prominences, right more than left, and above nasal bridge c/w rosacea  No results found for any visits on 03/18/20.  Assessment & Plan     1. Rosacea  - metroNIDAZOLE (METROGEL) 1 % gel; Apply topically daily.  Dispense: 45 g; Refill: 1  No follow-ups on file.      The entirety of the information documented in the History of Present Illness, Review of Systems and Physical Exam were personally obtained by me. Portions of this information were initially documented by the CMA and reviewed by me for thoroughness and accuracy.      Lelon Huh, MD  [facility] [phone] (phone) [phone] (fax)  [place]

## 2020-03-18 ENCOUNTER — Encounter: Payer: Self-pay | Admitting: Family Medicine

## 2020-03-18 ENCOUNTER — Other Ambulatory Visit: Payer: Self-pay

## 2020-03-18 ENCOUNTER — Ambulatory Visit: Payer: Medicare PPO | Admitting: Family Medicine

## 2020-03-18 ENCOUNTER — Other Ambulatory Visit: Payer: Self-pay | Admitting: Family Medicine

## 2020-03-18 VITALS — BP 140/82 | HR 83 | Temp 97.3°F | Resp 16 | Wt 155.0 lb

## 2020-03-18 DIAGNOSIS — L719 Rosacea, unspecified: Secondary | ICD-10-CM

## 2020-03-18 MED ORDER — METRONIDAZOLE 1 % EX GEL: Freq: Every day | CUTANEOUS | 1 refills | Status: DC

## 2020-03-18 NOTE — Telephone Encounter (Signed)
Opened in error

## 2020-03-18 NOTE — Telephone Encounter (Signed)
Patient has appointment today- 5/25 4:00 - sent for PCP review for appointment today

## 2020-03-18 NOTE — Patient Instructions (Signed)
. Please review the attached list of medications and notify my office if there are any errors.   . Please bring all of your medications to every appointment so we can make sure that our medication list is the same as yours.    Rosacea Rosacea is a long-term (chronic) condition that affects the skin of the face, including the cheeks, nose, forehead, and chin. This condition can also affect the eyes. Rosacea causes blood vessels near the surface of the skin to get bigger (be enlarged), and that makes the skin red. What are the causes? The cause of this condition is not known. Certain things can make rosacea worse, including:  Hot baths.  Exercise.  Sunlight.  Very hot or cold temperatures.  Hot or spicy foods and drinks.  Drinking alcohol.  Stress.  Taking blood pressure medicine.  Long-term use of topical steroids on the face. What increases the risk? You are more likely to get this condition if you:  Are older than 69 years of age.  Are a woman.  Have light-colored skin (light complexion).  Have a family history of the condition. What are the signs or symptoms?   Redness of the face.  Red bumps or pimples on the face.  A red, enlarged nose.  Blushing easily.  Red lines on the skin.  Irritated, burning, or itchy feeling in the eyes.  Swollen eyelids.  Drainage from the eyes.  Feeling like there is something in your eye. How is this treated? There is no cure for this condition, but treatment can help to control your symptoms. Your doctor may suggest that you see a skin specialist (dermatologist). Treatment may include:  Medicines that are put on the skin or taken by mouth (orally).  Laser treatment to improve how the skin looks.  Surgery. This is rare. Your doctor will also suggest the best way to take care of your skin. Even after your skin gets better, you will likely need to continue treatment to keep your rosacea from coming back. Follow these  instructions at home: Skin care Take care of your skin as told by your doctor. Your doctor may tell you to do these things:  Wash your skin gently two or more times each day.  Use mild soap.  Use a sunscreen or sunblock with SPF 30 or greater.  Use gentle cosmetics that are meant for sensitive skin.  Shave with an electric shaver instead of a blade. Lifestyle  Try to keep track of what foods make this condition worse. Avoid those foods. These may include: ? Spicy foods. ? Seafood. ? Cheese. ? Hot liquids. ? Nuts. ? Chocolate. ? Iodized salt.  Do not drink alcohol.  Avoid very cold or hot temperatures.  Try to reduce your stress. If you need help to do this, talk with your doctor.  When you exercise, do these things to stay cool: ? Limit sun exposure to your face. ? Use a fan. ? Exercise for a shorter time, and exercise more often. General instructions  Take and apply over-the-counter and prescription medicines only as told by your doctor.  If you were prescribed an antibiotic medicine, apply it or take it as told by your doctor. Do not stop using the antibiotic even if your condition improves.  If your eyelids are affected, hold warm compresses on them. Do this as told by your doctor.  Keep all follow-up visits as told by your doctor. This is important. Contact a doctor if:  Your symptoms get  worse.  Your symptoms do not improve after 2 months of treatment.  You have new symptoms.  You have any changes in how you see (vision) or you have problems with your eyes, such as redness or itching.  You feel very sad (depressed).  You do not want to eat as much as normal (lose your appetite).  You have trouble focusing your mind (concentrating). Summary  Rosacea is a long-term condition that affects the skin of the face, including the cheeks, nose, forehead, and chin.  Take care of your skin as told by your doctor.  Take and apply medicines only as told by your  doctor.  Contact a doctor if your symptoms get worse or if you have problems with your eyes. This information is not intended to replace advice given to you by your health care provider. Make sure you discuss any questions you have with your health care provider. Document Revised: 03/15/2018 Document Reviewed: 03/15/2018 Elsevier Patient Education  2020 [employer].

## 2020-04-14 ENCOUNTER — Telehealth: Payer: Self-pay | Admitting: Family Medicine

## 2020-04-14 NOTE — Telephone Encounter (Signed)
Patient is calling because she was previously seen for Rosacea on her cheeks. The Rosacea has now spread to her forehead and experiencing burning. Patient has scheduled appt for Friday 04/18/20 at  2:40p.  Patient is requesting medication or an antibotic possibly until she is able to see Dr. Caryn Section. Please advise [facility] [phone]

## 2020-04-14 NOTE — Telephone Encounter (Signed)
Attempted to contact patient, no answer left a  voicemail. Okay for [facility] triage to ask patient as stated below.

## 2020-04-14 NOTE — Telephone Encounter (Signed)
She was prescribed metronidazole last month. Did that help, or does she needs something different

## 2020-04-14 NOTE — Telephone Encounter (Signed)
Patient stated that medication "some times it helps and some times it doesn't". Patient reports new red, purplish spots "little dots" on forehead that burn and itch. Patient reports she has not put cream on forehead. Patient is wondering if metronidazole is too strong. Patient also requesting to make appt  earlier  Than scheduled appt for  Friday 04/18/20 due  to burning "broken out" area on forehead.

## 2020-04-18 ENCOUNTER — Other Ambulatory Visit: Payer: Self-pay

## 2020-04-18 ENCOUNTER — Encounter: Payer: Self-pay | Admitting: Family Medicine

## 2020-04-18 ENCOUNTER — Ambulatory Visit: Payer: Medicare PPO | Admitting: Family Medicine

## 2020-04-18 VITALS — BP 132/70 | HR 88 | Temp 97.6°F | Resp 16 | Wt 157.0 lb

## 2020-04-18 DIAGNOSIS — L719 Rosacea, unspecified: Secondary | ICD-10-CM | POA: Diagnosis not present

## 2020-04-18 NOTE — Patient Instructions (Signed)
.   Please review the attached list of medications and notify my office if there are any errors.   . Please bring all of your medications to every appointment so we can make sure that our medication list is the same as yours.    You can use OTC Eucerin cream or Cetaphil when you do not need to use the metronidazole.

## 2020-04-18 NOTE — Progress Notes (Signed)
     I,Roshena L Chambers,acting as a scribe for Lelon Huh, MD.,have documented all relevant documentation on the behalf of Lelon Huh, MD,as directed by  Lelon Huh, MD while in the presence of Lelon Huh, MD.  Established patient visit   Patient: Dana Harper   DOB: 1950/10/27   69 y.o. Female  MRN: [id number] Visit Date: 04/18/2020  Today's healthcare provider: Lelon Huh, MD   Chief Complaint  Patient presents with  . Rosacea   Subjective    HPI Follow up for Rosacea:  The patient was last seen for this 1 months ago. Changes made at last visit include started Metronidazole 1% topical.  She reports good compliance with treatment. She feels that condition is Improved. She states the redness on her cheeks has improved, but she has now started having burning pain on her forehead.  Burning sensation started 6 days ago. She has not applied any medication on her forehead.  She is not having side effects.   -----------------------------------------------------------------------------------------    Medications: Outpatient Medications Prior to Visit  Medication Sig  . amLODipine-benazepril (LOTREL) 5-20 MG capsule TAKE 1 CAPSULE BY MOUTH EVERY DAY  . aspirin 81 MG tablet Take 81 mg by mouth daily.   . cholecalciferol (VITAMIN D3) 25 MCG (1000 UT) tablet Take 1,000 Units by mouth daily.  . Coenzyme Q10 (COQ10) 100 MG CAPS Take 1 capsule by mouth daily.  Marland Kitchen loratadine (CLARITIN) 10 MG tablet Take 1 tablet (10 mg total) by mouth daily. (Patient taking differently: Take 10 mg by mouth daily as needed. )  . lovastatin (MEVACOR) 20 MG tablet TAKE 1 TABLET BY MOUTH EVERYDAY AT BEDTIME  . metroNIDAZOLE (METROGEL) 1 % gel Apply topically daily.  Marland Kitchen spironolactone (ALDACTONE) 25 MG tablet TAKE 1 TABLET BY MOUTH EVERY DAY  . [DISCONTINUED] Diphenhyd-Hydrocort-Nystatin (FIRST-DUKES MOUTHWASH) SUSP Use as directed 5 mLs in the mouth or throat 4 (four) times daily as needed.  (Patient not taking: Reported on 04/18/2020)  . [DISCONTINUED] Lysine 1000 MG TABS Take by mouth. One tablet 1-3 times daily (Patient not taking: Reported on 04/18/2020)   No facility-administered medications prior to visit.    Review of Systems  Constitutional: Negative for appetite change, chills, fatigue and fever.  Respiratory: Negative for chest tightness and shortness of breath.   Cardiovascular: Negative for chest pain and palpitations.  Gastrointestinal: Negative for abdominal pain, nausea and vomiting.  Neurological: Negative for dizziness and weakness.     Objective    BP 132/70 (BP Location: Left Arm, Patient Position: Sitting, Cuff Size: Normal)   Pulse 88   Temp 97.6 F (36.4 C) (Temporal)   Resp 16   Wt 157 lb (71.2 kg)   SpO2 98% Comment: room air  BMI 28.72 kg/m   Physical Exam  Mild malar and forehead erythema, improved from last visit.   No results found for any visits on 04/18/20.  Assessment & Plan     1. Rosacea Improved with initiation of metronidazole cream and may continue to use daily. Recommend she also use it on forehead.         The entirety of the information documented in the History of Present Illness, Review of Systems and Physical Exam were personally obtained by me. Portions of this information were initially documented by the CMA and reviewed by me for thoroughness and accuracy.      Lelon Huh, MD  [facility] [phone] (phone) [phone] (fax)  [place]

## 2020-06-16 ENCOUNTER — Encounter: Payer: Self-pay | Admitting: Family Medicine

## 2020-06-16 ENCOUNTER — Ambulatory Visit: Payer: Medicare PPO | Admitting: Family Medicine

## 2020-06-16 ENCOUNTER — Other Ambulatory Visit: Payer: Self-pay

## 2020-06-16 VITALS — BP 138/80 | HR 89 | Temp 98.3°F | Ht 62.0 in | Wt 159.6 lb

## 2020-06-16 DIAGNOSIS — I2510 Atherosclerotic heart disease of native coronary artery without angina pectoris: Secondary | ICD-10-CM | POA: Insufficient documentation

## 2020-06-16 DIAGNOSIS — I10 Essential (primary) hypertension: Secondary | ICD-10-CM | POA: Diagnosis not present

## 2020-06-16 NOTE — Progress Notes (Signed)
Established patient visit   Patient: Dana Harper   DOB: [birth year]   69 y.o. Female  MRN: [id number] Visit Date: 06/16/2020  Today's healthcare provider: Lelon Huh, MD   Chief Complaint  Patient presents with  . Hyperlipidemia  . Hypertension  . vitamin D deficiency   [employer] as a scribe for Lelon Huh, MD.,have documented all relevant documentation on the behalf of Lelon Huh, MD,as directed by  Lelon Huh, MD while in the presence of Lelon Huh, MD.  Subjective    HPI  Hypertension, follow-up  BP Readings from Last 3 Encounters:  06/16/20 (!) 154/77  04/18/20 132/70  03/18/20 140/82   Wt Readings from Last 3 Encounters:  06/16/20 159 lb 9.6 oz (72.4 kg)  04/18/20 157 lb (71.2 kg)  03/18/20 155 lb (70.3 kg)     She was last seen for hypertension 3 months ago.  BP at that visit was 152/90. Management since that visit includes patient stated she was going to restart spironlactone.  She reports good compliance with treatment. She is not having side effects.  She is following a Regular diet. She is exercising. She does not smoke.  Use of agents associated with hypertension: none.   Outside blood pressures are being checked at home. Symptoms: No chest pain No chest pressure  No palpitations No syncope  No dyspnea No orthopnea  No paroxysmal nocturnal dyspnea No lower extremity edema   Pertinent labs: Lab Results  Component Value Date   CHOL 177 03/12/2020   HDL 60 03/12/2020   LDLCALC 101 (H) 03/12/2020   TRIG 90 03/12/2020   CHOLHDL 3.0 03/12/2020   Lab Results  Component Value Date   NA 140 03/12/2020   K 3.9 03/12/2020   CREATININE 0.90 03/12/2020   GFRNONAA 66 03/12/2020   GFRAA 76 03/12/2020   GLUCOSE 96 03/12/2020     The 10-year ASCVD risk score Mikey Bussing DC Jr., et al., 2013) is: 15.1%   --------------------------------------------------------------------------------------------------- Follow up for  Vitamin D deficiency:  The patient was last seen for this 3 months ago. Changes made at last visit include no change.  She reports good compliance with treatment. She feels that condition is stable. She is not having side effects.   -----------------------------------------------------------------------------------------  Lipid/Cholesterol, Follow-up  Last lipid panel Other pertinent labs  Lab Results  Component Value Date   CHOL 177 03/12/2020   HDL 60 03/12/2020   LDLCALC 101 (H) 03/12/2020   TRIG 90 03/12/2020   CHOLHDL 3.0 03/12/2020   Lab Results  Component Value Date   ALT 14 03/12/2020   AST 19 03/12/2020   PLT 275 03/12/2020   TSH 0.998 03/12/2020     She was last seen for this 3 months ago.  Management since that visit includes no change.  She reports good compliance with treatment. She is not having side effects.   Symptoms: No chest pain No chest pressure/discomfort  No dyspnea No lower extremity edema  No numbness or tingling of extremity No orthopnea  No palpitations No paroxysmal nocturnal dyspnea  No speech difficulty No syncope   Current diet: in general, a "healthy" diet   Current exercise: walking  The 10-year ASCVD risk score Mikey Bussing DC Jr., et al., 2013) is: 15.1%  ---------------------------------------------------------------------------------------------------     Medications: Outpatient Medications Prior to Visit  Medication Sig  . amLODipine-benazepril (LOTREL) 5-20 MG capsule TAKE 1 CAPSULE BY MOUTH EVERY DAY  . aspirin 81 MG tablet Take 81 mg by mouth  daily.   . cholecalciferol (VITAMIN D3) 25 MCG (1000 UT) tablet Take 1,000 Units by mouth daily.  . Coenzyme Q10 (COQ10) 100 MG CAPS Take 1 capsule by mouth daily.  Marland Kitchen loratadine (CLARITIN) 10 MG tablet Take 1 tablet (10 mg total) by mouth daily. (Patient taking differently: Take 10 mg by mouth daily as needed. )  . lovastatin (MEVACOR) 20 MG tablet TAKE 1 TABLET BY MOUTH EVERYDAY AT  BEDTIME  . metroNIDAZOLE (METROGEL) 1 % gel Apply topically daily.  Marland Kitchen spironolactone (ALDACTONE) 25 MG tablet TAKE 1 TABLET BY MOUTH EVERY DAY   No facility-administered medications prior to visit.    Review of Systems  Constitutional: Negative.   Respiratory: Negative.   Cardiovascular: Negative.   Musculoskeletal: Negative.      Objective    BP 138/80   Pulse 89   Temp 98.3 F (36.8 C) (Oral)   Ht 5\' 2"  (1.575 m)   Wt 159 lb 9.6 oz (72.4 kg)   BMI 29.19 kg/m    Physical Exam   General appearance:  Overweight female, cooperative and in no acute distress Head: Normocephalic, without obvious abnormality, atraumatic Respiratory: Respirations even and unlabored, normal respiratory rate Extremities: All extremities are intact.  Skin: Skin color, texture, turgor normal. No rashes seen  Psych: Appropriate mood and affect. Neurologic: Mental status: Alert, oriented to person, place, and time, thought content appropriate.     Assessment & Plan     1. Benign essential HTN Better since starting back on spironolactone which she is tolerating well. Continue current medications.      follow up BP 4-5 months.      The entirety of the information documented in the History of Present Illness, Review of Systems and Physical Exam were personally obtained by me. Portions of this information were initially documented by the CMA and reviewed by me for thoroughness and accuracy.      Lelon Huh, MD  [facility] [phone] (phone) [phone] (fax)  [place]

## 2020-08-06 ENCOUNTER — Ambulatory Visit (INDEPENDENT_AMBULATORY_CARE_PROVIDER_SITE_OTHER): Payer: Medicare PPO

## 2020-08-06 ENCOUNTER — Other Ambulatory Visit: Payer: Self-pay

## 2020-08-06 DIAGNOSIS — Z23 Encounter for immunization: Secondary | ICD-10-CM | POA: Diagnosis not present

## 2020-09-10 ENCOUNTER — Other Ambulatory Visit: Payer: Self-pay | Admitting: Family Medicine

## 2020-10-20 NOTE — Progress Notes (Signed)
Virtual telephone visit    Virtual Visit via Telephone Note   This visit type was conducted due to national recommendations for restrictions regarding the COVID-19 Pandemic (e.g. social distancing) in an effort to limit this patient's exposure and mitigate transmission in our community. Due to her co-morbid illnesses, this patient is at least at moderate risk for complications without adequate follow up. This format is felt to be most appropriate for this patient at this time. The patient did not have access to video technology or had technical difficulties with video requiring transitioning to audio format only (telephone). Physical exam was limited to content and character of the telephone converstion.    Patient location: home Provider location: [facility]  I discussed the limitations of evaluation and management by telemedicine and the availability of in person appointments. The patient expressed understanding and agreed to proceed.   Visit Date: 10/21/2020  Today's healthcare provider: Mila Merry, MD   Chief Complaint  Patient presents with  . Cough   Subjective    Cough This is a new problem. Episode onset: 3 days ago. The problem has been unchanged. The cough is productive of sputum (white- clear colored mucus). Pertinent negatives include no chest pain, chills, ear pain, fever, postnasal drip, rhinorrhea, sore throat or shortness of breath. Treatments tried: Delsym. The treatment provided moderate relief.   Patient reports symptoms worsen at night.  No fevers, chills. No muscle aches. Symptoms very typical for her yearly cold. Has had two dose of Covid vaccine and was scheduled to get booster this week.       Medications: Outpatient Medications Prior to Visit  Medication Sig  . amLODipine-benazepril (LOTREL) 5-20 MG capsule TAKE 1 CAPSULE BY MOUTH EVERY DAY  . aspirin 81 MG tablet Take 81 mg by mouth every other day.  . cholecalciferol (VITAMIN D3) 25 MCG (1000 UT)  tablet Take 1,000 Units by mouth daily.  . Coenzyme Q10 (COQ10) 100 MG CAPS Take 1 capsule by mouth daily.  Marland Kitchen lovastatin (MEVACOR) 20 MG tablet TAKE 1 TABLET BY MOUTH EVERYDAY AT BEDTIME  . spironolactone (ALDACTONE) 25 MG tablet TAKE 1 TABLET BY MOUTH EVERY DAY  . loratadine (CLARITIN) 10 MG tablet Take 1 tablet (10 mg total) by mouth daily. (Patient not taking: Reported on 10/20/2020)  . metroNIDAZOLE (METROGEL) 1 % gel Apply topically daily. (Patient not taking: Reported on 10/20/2020)   No facility-administered medications prior to visit.    Review of Systems  Constitutional: Negative for appetite change, chills, fatigue and fever.  HENT: Positive for congestion (nasal congestion). Negative for ear discharge, ear pain, nosebleeds, postnasal drip, rhinorrhea, sinus pressure, sinus pain, sneezing, sore throat and trouble swallowing.   Respiratory: Positive for cough. Negative for chest tightness and shortness of breath.   Cardiovascular: Negative for chest pain and palpitations.  Gastrointestinal: Negative for abdominal pain, nausea and vomiting.  Neurological: Negative for dizziness and weakness.      Objective    There were no vitals taken for this visit.   Awake, alert, oriented x 3. In no apparent distress   Assessment & Plan     1. Upper respiratory tract infection, unspecified type   2. Cough  - HYDROcodone-homatropine (HYCODAN) 5-1.5 MG/5ML syrup; Take 5 mLs by mouth every 8 (eight) hours as needed for cough.  Dispense: 120 mL; Refill: 0      I discussed the assessment and treatment plan with the patient. The patient was provided an opportunity to ask questions and all were  answered. The patient agreed with the plan and demonstrated an understanding of the instructions.   The patient was advised to call back or seek an in-person evaluation if the symptoms worsen or if the condition fails to improve as anticipated.  I provided 11 minutes of non-face-to-face time  during this encounter.  The entirety of the information documented in the History of Present Illness, Review of Systems and Physical Exam were personally obtained by me. Portions of this information were initially documented by the CMA and reviewed by me for thoroughness and accuracy.     Mila Merry, MD [facility] [phone] (phone) [phone] (fax)  [facility]

## 2020-10-21 ENCOUNTER — Encounter: Payer: Self-pay | Admitting: Family Medicine

## 2020-10-21 ENCOUNTER — Telehealth: Payer: Self-pay | Admitting: Family Medicine

## 2020-10-21 ENCOUNTER — Ambulatory Visit (INDEPENDENT_AMBULATORY_CARE_PROVIDER_SITE_OTHER): Payer: Medicare PPO | Admitting: Family Medicine

## 2020-10-21 ENCOUNTER — Telehealth: Payer: Medicare PPO | Admitting: Family Medicine

## 2020-10-21 DIAGNOSIS — J069 Acute upper respiratory infection, unspecified: Secondary | ICD-10-CM

## 2020-10-21 DIAGNOSIS — R059 Cough, unspecified: Secondary | ICD-10-CM

## 2020-10-21 MED ORDER — HYDROCOD POLST-CPM POLST ER 10-8 MG/5ML PO SUER: 5.0000 mL | Freq: Two times a day (BID) | ORAL | 0 refills | Status: DC | PRN

## 2020-10-21 MED ORDER — HYDROCODONE-HOMATROPINE 5-1.5 MG/5ML PO SYRP: 5.0000 mL | ORAL_SOLUTION | Freq: Three times a day (TID) | ORAL | 0 refills | Status: DC | PRN

## 2020-10-21 NOTE — Telephone Encounter (Signed)
No preference between liquid or tablets Mucinex, although the liquid doesn't taste very good.  Have sent prescription for Tussionex.

## 2020-10-21 NOTE — Telephone Encounter (Addendum)
Returned call to patient. Patient wanted to know if Dr. Sherrie Mustache advising liquid Mucinex or tablet form. Please advise? Also per patient there was an issue at the pharmacy concerning rx for Hycodan. Called pharmacy who stated Hycodan is on back order. Pharmacy advised we can switch to Tussoinex.

## 2020-10-21 NOTE — Telephone Encounter (Signed)
Patient needs some clarity on the medication prescribed this morning.

## 2020-10-22 NOTE — Telephone Encounter (Signed)
Patient was advised.  

## 2020-10-31 ENCOUNTER — Other Ambulatory Visit: Payer: Self-pay

## 2020-10-31 ENCOUNTER — Encounter: Payer: Self-pay | Admitting: Family Medicine

## 2020-10-31 ENCOUNTER — Ambulatory Visit: Payer: Medicare PPO | Admitting: Family Medicine

## 2020-10-31 VITALS — BP 142/84 | HR 96 | Temp 98.8°F | Resp 16 | Ht 62.0 in | Wt 160.0 lb

## 2020-10-31 DIAGNOSIS — L304 Erythema intertrigo: Secondary | ICD-10-CM | POA: Diagnosis not present

## 2020-10-31 DIAGNOSIS — I10 Essential (primary) hypertension: Secondary | ICD-10-CM

## 2020-10-31 DIAGNOSIS — Z23 Encounter for immunization: Secondary | ICD-10-CM | POA: Diagnosis not present

## 2020-10-31 DIAGNOSIS — J069 Acute upper respiratory infection, unspecified: Secondary | ICD-10-CM

## 2020-10-31 MED ORDER — TETANUS-DIPHTH-ACELL PERTUSSIS 5-2.5-18.5 LF-MCG/0.5 IM SUSY: 0.5000 mL | PREFILLED_SYRINGE | Freq: Once | INTRAMUSCULAR | 0 refills | Status: AC

## 2020-10-31 NOTE — Patient Instructions (Addendum)
.   Please review the attached list of medications and notify my office if there are any errors.   . You can use OTC Lotrimin (clotrimezole) cream to rash on abdomen as needed.    You can get the [place] booster at any time. You are also due for a tetanus vaccin

## 2020-10-31 NOTE — Progress Notes (Signed)
Established patient visit   Patient: Dana Harper   DOB: July 29, 1951   70 y.o. Female  MRN: [id number] Visit Date: 10/31/2020  Today's healthcare provider: Lelon Huh, MD   Chief Complaint  Patient presents with  . Hypertension   Subjective    HPI  Hypertension, follow-up  BP Readings from Last 3 Encounters:  10/31/20 (!) 142/84  06/16/20 138/80  04/18/20 132/70   Wt Readings from Last 3 Encounters:  10/31/20 160 lb (72.6 kg)  06/16/20 159 lb 9.6 oz (72.4 kg)  04/18/20 157 lb (71.2 kg)     She was last seen for hypertension 4 months ago.  BP at that visit was 138/80. Management since that visit includes no changes.  She reports good compliance with treatment. She not having side effects.  She is following a Regular diet. She is not exercising. She does not smoke.  Use of agents associated with hypertension: none.   Outside blood pressures are not being checked. Symptoms: No chest pain No chest pressure  No palpitations No syncope  No dyspnea No orthopnea  No paroxysmal nocturnal dyspnea No lower extremity edema   Pertinent labs: Lab Results  Component Value Date   CHOL 177 03/12/2020   HDL 60 03/12/2020   LDLCALC 101 (H) 03/12/2020   TRIG 90 03/12/2020   CHOLHDL 3.0 03/12/2020   Lab Results  Component Value Date   NA 140 03/12/2020   K 3.9 03/12/2020   CREATININE 0.90 03/12/2020   GFRNONAA 66 03/12/2020   GFRAA 76 03/12/2020   GLUCOSE 96 03/12/2020     The 10-year ASCVD risk score Mikey Bussing DC Jr., et al., 2013) is: 13%    She was recently treat for cough and URI which has completely resolved except for some clear nasal drainage. She also reports Rosacea cleared up with metrinidazole cream which she is no longer needing to use.   She does have rash in lower abdomen which is itchy. Has been using Lotrimin cream which helps a little bit.    Medications: Outpatient Medications Prior to Visit  Medication Sig  . amLODipine-benazepril  (LOTREL) 5-20 MG capsule TAKE 1 CAPSULE BY MOUTH EVERY DAY  . aspirin 81 MG tablet Take 81 mg by mouth every other day.  . cholecalciferol (VITAMIN D3) 25 MCG (1000 UT) tablet Take 1,000 Units by mouth daily.  . Coenzyme Q10 (COQ10) 100 MG CAPS Take 1 capsule by mouth daily.  Marland Kitchen lovastatin (MEVACOR) 20 MG tablet TAKE 1 TABLET BY MOUTH EVERYDAY AT BEDTIME  . spironolactone (ALDACTONE) 25 MG tablet TAKE 1 TABLET BY MOUTH EVERY DAY  . chlorpheniramine-HYDROcodone (TUSSIONEX PENNKINETIC ER) 10-8 MG/5ML SUER Take 5 mLs by mouth every 12 (twelve) hours as needed for cough. (Patient not taking: Reported on 10/31/2020)  . loratadine (CLARITIN) 10 MG tablet Take 1 tablet (10 mg total) by mouth daily. (Patient not taking: No sig reported)  . metroNIDAZOLE (METROGEL) 1 % gel Apply topically daily. (Patient not taking: No sig reported)   No facility-administered medications prior to visit.    Review of Systems  Constitutional: Negative.   Respiratory: Negative for cough and shortness of breath.   Cardiovascular: Negative for chest pain, palpitations and leg swelling.  Musculoskeletal: Negative.   Neurological: Negative for dizziness, light-headedness and headaches.      Objective    BP (!) 142/84   Pulse 96   Temp 98.8 F (37.1 C)   Resp 16   Ht 5\' 2"  (1.575 m)  Wt 160 lb (72.6 kg)   BMI 29.26 kg/m    Physical Exam    General: Appearance:     Well developed, well nourished female in no acute distress  Eyes:    PERRL, conjunctiva/corneas clear, EOM's intact       Lungs:     Clear to auscultation bilaterally, respirations unlabored  Heart:    Normal heart rate. Normal rhythm. No murmurs, rubs, or gallops.   MS:   All extremities are intact.   Neurologic:   Awake, alert, oriented x 3. No apparent focal neurological           defect.          Assessment & Plan     1. Benign essential HTN Relatively well controlled. Continue current medications.    2. Upper respiratory tract  infection, unspecified type Resolved.   3. Prescription for Tdap. Vaccine not administered in office.   - Tdap ([place]) 5-2.5-18.5 LF-MCG/0.5 injection; Inject 0.5 mLs into the muscle once for 1 dose.  Dispense: 0.5 mL; Refill: 0  4. Intertrigo  Recommend OTC antifungal creams prn.       The entirety of the information documented in the History of Present Illness, Review of Systems and Physical Exam were personally obtained by me. Portions of this information were initially documented by the CMA and reviewed by me for thoroughness and accuracy.      Lelon Huh, MD  [facility] [phone] (phone) [phone] (fax)  [place]

## 2020-12-06 ENCOUNTER — Other Ambulatory Visit: Payer: Self-pay | Admitting: Family Medicine

## 2020-12-08 ENCOUNTER — Other Ambulatory Visit: Payer: Self-pay | Admitting: Family Medicine

## 2021-03-04 ENCOUNTER — Other Ambulatory Visit: Payer: Self-pay | Admitting: Family Medicine

## 2021-03-04 NOTE — Telephone Encounter (Signed)
Requested Prescriptions  Pending Prescriptions Disp Refills  . amLODipine-benazepril (LOTREL) 5-20 MG capsule [Pharmacy Med Name: AMLODIPINE-BENAZEPRIL 5-20 MG] 90 capsule 0    Sig: TAKE 1 CAPSULE BY MOUTH EVERY DAY     Cardiovascular: CCB + ACEI Combos Failed - 03/04/2021  3:21 AM      Failed - Cr in normal range and within 180 days    Creatinine  Date Value Ref Range Status  12/11/2013 0.90 0.60 - 1.30 mg/dL Final   Creatinine, Ser  Date Value Ref Range Status  03/12/2020 0.90 0.57 - 1.00 mg/dL Final         Failed - K in normal range and within 180 days    Potassium  Date Value Ref Range Status  03/12/2020 3.9 3.5 - 5.2 mmol/L Final  12/11/2013 3.9 3.5 - 5.1 mmol/L Final         Failed - Last BP in normal range    BP Readings from Last 1 Encounters:  10/31/20 (!) 142/84         Passed - Patient is not pregnant      Passed - Valid encounter within last 6 months    Recent Outpatient Visits          4 months ago Benign essential HTN   [place] Birdie Sons, MD   4 months ago Upper respiratory tract infection, unspecified type   [facility] Birdie Sons, MD   8 months ago Benign essential HTN   [facility] Birdie Sons, MD   10 months ago Black, Donald E, MD   11 months ago [place], Donald E, MD             . lovastatin (MEVACOR) 20 MG tablet [Pharmacy Med Name: LOVASTATIN 20 MG TABLET] 90 tablet 0    Sig: TAKE 1 TABLET BY MOUTH EVERYDAY AT BEDTIME     Cardiovascular:  Antilipid - Statins Failed - 03/04/2021  3:21 AM      Failed - LDL in normal range and within 360 days    LDL Chol Calc (NIH)  Date Value Ref Range Status  03/12/2020 101 (H) 0 - 99 mg/dL Final         Passed - Total Cholesterol in normal range and within 360 days    Cholesterol, Total  Date Value Ref Range Status  03/12/2020 177 100 - 199 mg/dL Final          Passed - HDL in normal range and within 360 days    HDL  Date Value Ref Range Status  03/12/2020 60 >39 mg/dL Final         Passed - Triglycerides in normal range and within 360 days    Triglycerides  Date Value Ref Range Status  03/12/2020 90 0 - 149 mg/dL Final         Passed - Patient is not pregnant      Passed - Valid encounter within last 12 months    Recent Outpatient Visits          4 months ago Benign essential HTN   [facility] Birdie Sons, MD   4 months ago Upper respiratory tract infection, unspecified type   [facility] Birdie Sons, MD   8 months ago Benign essential HTN   [facility] Birdie Sons, MD   10 months ago [facility] Birdie Sons, MD   11 months ago [place], Kirstie Peri, MD

## 2021-03-17 ENCOUNTER — Other Ambulatory Visit: Payer: Self-pay

## 2021-03-17 ENCOUNTER — Ambulatory Visit (INDEPENDENT_AMBULATORY_CARE_PROVIDER_SITE_OTHER): Payer: Medicare PPO | Admitting: Family Medicine

## 2021-03-17 ENCOUNTER — Encounter: Payer: Self-pay | Admitting: Family Medicine

## 2021-03-17 VITALS — BP 127/68 | HR 83 | Resp 16 | Ht 62.0 in | Wt 164.2 lb

## 2021-03-17 DIAGNOSIS — E7800 Pure hypercholesterolemia, unspecified: Secondary | ICD-10-CM | POA: Diagnosis not present

## 2021-03-17 DIAGNOSIS — Z1211 Encounter for screening for malignant neoplasm of colon: Secondary | ICD-10-CM

## 2021-03-17 DIAGNOSIS — Z1231 Encounter for screening mammogram for malignant neoplasm of breast: Secondary | ICD-10-CM | POA: Diagnosis not present

## 2021-03-17 DIAGNOSIS — Z86010 Personal history of colonic polyps: Secondary | ICD-10-CM

## 2021-03-17 DIAGNOSIS — I2510 Atherosclerotic heart disease of native coronary artery without angina pectoris: Secondary | ICD-10-CM

## 2021-03-17 DIAGNOSIS — E559 Vitamin D deficiency, unspecified: Secondary | ICD-10-CM

## 2021-03-17 DIAGNOSIS — E2839 Other primary ovarian failure: Secondary | ICD-10-CM

## 2021-03-17 DIAGNOSIS — I10 Essential (primary) hypertension: Secondary | ICD-10-CM

## 2021-03-17 DIAGNOSIS — Z23 Encounter for immunization: Secondary | ICD-10-CM

## 2021-03-17 DIAGNOSIS — Z289 Immunization not carried out for unspecified reason: Secondary | ICD-10-CM

## 2021-03-17 DIAGNOSIS — Z0000 Encounter for general adult medical examination without abnormal findings: Secondary | ICD-10-CM

## 2021-03-17 MED ORDER — TETANUS-DIPHTH-ACELL PERTUSSIS 5-2.5-18.5 LF-MCG/0.5 IM SUSY: 0.5000 mL | PREFILLED_SYRINGE | Freq: Once | INTRAMUSCULAR | 0 refills | Status: AC

## 2021-03-17 MED ORDER — SHINGRIX 50 MCG/0.5ML IM SUSR: 0.5000 mL | Freq: Once | INTRAMUSCULAR | 0 refills | Status: AC

## 2021-03-17 NOTE — Patient Instructions (Addendum)
The CDC recommends two doses of Shingrix (the shingles vaccine) separated by 2 to 6 months for adults age 70 years and older. I recommend checking with your pharmacy plan regarding coverage for this vaccine.     . You are due for a Tdap (tetanus-diptheria-pertussis vaccine) which protects you from tetanus and whooping cough. Please check with your insurance plan or pharmacy regarding coverage for this vaccine.    I strongly recommend at least one booster dose (a third shot) of the Covid vaccine for all adults. People at high risk for serious Covid infections should have a second booster dose 4-6 months after the first booster.      Health Maintenance After Age 34 After age 56, you are at a higher risk for certain long-term diseases and infections as well as injuries from falls. Falls are a major cause of broken bones and head injuries in people who are older than age 76. Getting regular preventive care can help to keep you healthy and well. Preventive care includes getting regular testing and making lifestyle changes as recommended by your health care provider. Talk with your health care provider about:  Which screenings and tests you should have. A screening is a test that checks for a disease when you have no symptoms.  A diet and exercise plan that is right for you. What should I know about screenings and tests to prevent falls? Screening and testing are the best ways to find a health problem early. Early diagnosis and treatment give you the best chance of managing medical conditions that are common after age 17. Certain conditions and lifestyle choices may make you more likely to have a fall. Your health care provider may recommend:  Regular vision checks. Poor vision and conditions such as cataracts can make you more likely to have a fall. If you wear glasses, make sure to get your prescription updated if your vision changes.  Medicine review. Work with your health care provider to  regularly review all of the medicines you are taking, including over-the-counter medicines. Ask your health care provider about any side effects that may make you more likely to have a fall. Tell your health care provider if any medicines that you take make you feel dizzy or sleepy.  Osteoporosis screening. Osteoporosis is a condition that causes the bones to get weaker. This can make the bones weak and cause them to break more easily.  Blood pressure screening. Blood pressure changes and medicines to control blood pressure can make you feel dizzy.  Strength and balance checks. Your health care provider may recommend certain tests to check your strength and balance while standing, walking, or changing positions.  Foot health exam. Foot pain and numbness, as well as not wearing proper footwear, can make you more likely to have a fall.  Depression screening. You may be more likely to have a fall if you have a fear of falling, feel emotionally low, or feel unable to do activities that you used to do.  Alcohol use screening. Using too much alcohol can affect your balance and may make you more likely to have a fall. What actions can I take to lower my risk of falls? General instructions  Talk with your health care provider about your risks for falling. Tell your health care provider if: ? You fall. Be sure to tell your health care provider about all falls, even ones that seem minor. ? You feel dizzy, sleepy, or off-balance.  Take over-the-counter and prescription medicines  only as told by your health care provider. These include any supplements.  Eat a healthy diet and maintain a healthy weight. A healthy diet includes low-fat dairy products, low-fat (lean) meats, and fiber from whole grains, beans, and lots of fruits and vegetables. Home safety  Remove any tripping hazards, such as rugs, cords, and clutter.  Install safety equipment such as grab bars in bathrooms and safety rails on  stairs.  Keep rooms and walkways well-lit. Activity  Follow a regular exercise program to stay fit. This will help you maintain your balance. Ask your health care provider what types of exercise are appropriate for you.  If you need a cane or walker, use it as recommended by your health care provider.  Wear supportive shoes that have nonskid soles.   Lifestyle  Do not drink alcohol if your health care provider tells you not to drink.  If you drink alcohol, limit how much you have: ? 0-1 drink a day for women. ? 0-2 drinks a day for men.  Be aware of how much alcohol is in your drink. In the [place] one drink equals one typical bottle of beer (12 oz), one-half glass of wine (5 oz), or one shot of hard liquor (1 oz).  Do not use any products that contain nicotine or tobacco, such as cigarettes and e-cigarettes. If you need help quitting, ask your health care provider. Summary  Having a healthy lifestyle and getting preventive care can help to protect your health and wellness after age 67.  Screening and testing are the best way to find a health problem early and help you avoid having a fall. Early diagnosis and treatment give you the best chance for managing medical conditions that are more common for people who are older than age 52.  Falls are a major cause of broken bones and head injuries in people who are older than age 49. Take precautions to prevent a fall at home.  Work with your health care provider to learn what changes you can make to improve your health and wellness and to prevent falls. This information is not intended to replace advice given to you by your health care provider. Make sure you discuss any questions you have with your health care provider. Document Revised: 02/01/2019 Document Reviewed: 08/24/2017 Elsevier Patient Education  [place].  Please call the [facility] at [phone] to schedule your  mammogram.

## 2021-03-17 NOTE — Progress Notes (Signed)
Annual Wellness Visit     Patient: Dana Harper, Female    DOB: January 23, 1951, 70 y.o.   MRN: [id number] Visit Date: 03/17/2021  Today's Provider: Lelon Huh, MD   Chief Complaint  Patient presents with  . Hypertension  . Hyperlipidemia  . vitamin D deficiency  . Osteopenia follow up   Subjective    Dana Harper is a 70 y.o. female who presents today for her Annual Wellness Visit.      Medications: Outpatient Medications Prior to Visit  Medication Sig  . amLODipine-benazepril (LOTREL) 5-20 MG capsule TAKE 1 CAPSULE BY MOUTH EVERY DAY  . aspirin 81 MG tablet Take 81 mg by mouth every other day.  . cholecalciferol (VITAMIN D3) 25 MCG (1000 UT) tablet Take 1,000 Units by mouth daily.  . Coenzyme Q10 (COQ10) 100 MG CAPS Take 1 capsule by mouth daily.  Marland Kitchen lovastatin (MEVACOR) 20 MG tablet TAKE 1 TABLET BY MOUTH EVERYDAY AT BEDTIME  . spironolactone (ALDACTONE) 25 MG tablet TAKE 1 TABLET BY MOUTH EVERY DAY   No facility-administered medications prior to visit.    Allergies  Allergen Reactions  . Demerol  [Meperidine]   . Codeine Rash    Patient Care Team: Birdie Sons, MD as PCP - General (Family Medicine)  Review of Systems      Objective      Most recent functional status assessment: In your present state of health, do you have any difficulty performing the following activities: 03/17/2021  Hearing? N  Vision? N  Difficulty concentrating or making decisions? N  Walking or climbing stairs? N  Dressing or bathing? N  Doing errands, shopping? N  Some recent data might be hidden   Most recent fall risk assessment: Fall Risk  03/17/2021  Falls in the past year? 0  Number falls in past yr: 0  Injury with Fall? 0  Follow up -    Most recent depression screenings: PHQ 2/9 Scores 03/17/2021 03/11/2020  PHQ - 2 Score 0 0  PHQ- 9 Score 0 -   Most recent cognitive screening: 6CIT Screen 12/17/2016  What Year? 0 points  What month? 0 points   What time? 0 points  Count back from 20 0 points  Months in reverse 0 points  Repeat phrase 2 points  Total Score 2   Most recent Audit-C alcohol use screening Alcohol Use Disorder Test (AUDIT) 03/17/2021  1. How often do you have a drink containing alcohol? 0  2. How many drinks containing alcohol do you have on a typical day when you are drinking? 0  3. How often do you have six or more drinks on one occasion? 0  AUDIT-C Score 0  Alcohol Brief Interventions/Follow-up -   A score of 3 or more in women, and 4 or more in men indicates increased risk for alcohol abuse, EXCEPT if all of the points are from question 1   No results found for any visits on 03/17/21.  Assessment & Plan     Annual wellness visit done today including the all of the following: Reviewed patient's Family Medical History Reviewed and updated list of patient's medical providers Assessment of cognitive impairment was done Assessed patient's functional ability Established a written schedule for health screening [place] Completed and Reviewed  Exercise Activities and Dietary recommendations Goals    . Weight (lb) < 140 lb (63.5 kg)     Starting 12/17/16, I will work to lose 25 pounds this  year.   12/20/17, recommend to continue diet and exercise to aid in weight loss goal.        Immunization History  Administered Date(s) Administered  . Fluad Quad(high Dose 65+) 08/03/2019, 08/06/2020  . Influenza Split 10/22/2010  . Influenza, High Dose Seasonal PF 09/05/2018  . Influenza,inj,Quad PF,6+ Mos 08/14/2013, 10/30/2015  . Influenza-Unspecified 08/13/2016, 08/12/2017  . Moderna Sars-Covid-2 Vaccination 11/20/2019, 12/18/2019  . Pneumococcal Conjugate-13 12/20/2017  . Pneumococcal Polysaccharide-23 12/21/2018  . Tdap 05/29/2008  . Zoster 07/10/2014    Health Maintenance  Topic Date Due  . TETANUS/TDAP  05/29/2018  . DEXA SCAN  01/01/2020  . COVID-19 Vaccine (3 - Booster for  Moderna series) 05/16/2020  . COLONOSCOPY (Pts 45-48yrs Insurance coverage will need to be confirmed)  12/01/2020  . MAMMOGRAM  02/19/2021  . INFLUENZA VACCINE  05/25/2021  . Hepatitis C Screening  Completed  . PNA vac Low Risk Adult  Completed  . HPV VACCINES  Aged Out     Discussed health benefits of physical activity, and encouraged her to engage in regular exercise appropriate for her age and condition.         The entirety of the information documented in the History of Present Illness, Review of Systems and Physical Exam were personally obtained by me. Portions of this information were initially documented by the CMA and reviewed by me for thoroughness and accuracy.      Lelon Huh, MD  [facility] [phone] (phone) [phone] (fax)  [place]

## 2021-03-17 NOTE — Progress Notes (Signed)
Complete physical exam      Patient: Dana Harper, Female    DOB: [birth year], 69 y.o.   MRN: [id number] Visit Date: 03/17/2021  Today's Provider: Lelon Huh, MD   Chief Complaint  Patient presents with  . Hypertension  . Hyperlipidemia  . vitamin D deficiency  . Osteopenia follow up   Subjective    Dana Harper is a 70 y.o. female who presents today for her complete physical exam. She reports consuming a general diet.  She generally feels well. She reports sleeping well. She does not have additional problems to discuss today.   HPI Hypertension, follow-up  BP Readings from Last 3 Encounters:  03/17/21 127/68  10/31/20 (!) 142/84  06/16/20 138/80   Wt Readings from Last 3 Encounters:  03/17/21 164 lb 3.2 oz (74.5 kg)  10/31/20 160 lb (72.6 kg)  06/16/20 159 lb 9.6 oz (72.4 kg)     She was last seen for hypertension 4 months ago.  BP at that visit was 142/84. Management since that visit includes continue same medication.  She reports excellent compliance with treatment. She is not having side effects.  She is following a Regular diet. She is exercising. She does not smoke.  Use of agents associated with hypertension: NSAIDS.   Outside blood pressures are checked on occasion. Symptoms: No chest pain No chest pressure  No palpitations No syncope  No dyspnea No orthopnea  No paroxysmal nocturnal dyspnea No lower extremity edema   Pertinent labs: Lab Results  Component Value Date   CHOL 177 03/12/2020   HDL 60 03/12/2020   LDLCALC 101 (H) 03/12/2020   TRIG 90 03/12/2020   CHOLHDL 3.0 03/12/2020   Lab Results  Component Value Date   NA 140 03/12/2020   K 3.9 03/12/2020   CREATININE 0.90 03/12/2020   GFRNONAA 66 03/12/2020   GFRAA 76 03/12/2020   GLUCOSE 96 03/12/2020     The 10-year ASCVD risk score Mikey Bussing DC Jr., et al., 2013) is: 10.5%    ---------------------------------------------------------------------------------------------------  Lipid/Cholesterol, Follow-up  Last lipid panel Other pertinent labs  Lab Results  Component Value Date   CHOL 177 03/12/2020   HDL 60 03/12/2020   LDLCALC 101 (H) 03/12/2020   TRIG 90 03/12/2020   CHOLHDL 3.0 03/12/2020   Lab Results  Component Value Date   ALT 14 03/12/2020   AST 19 03/12/2020   PLT 275 03/12/2020   TSH 0.998 03/12/2020     She was last seen for this 1 year ago.  Management since that visit includes continue lovastatin 20mg  daily.  She reports excellent compliance with treatment. She is not having side effects.   Symptoms: No chest pain No chest pressure/discomfort  No dyspnea No lower extremity edema  No numbness or tingling of extremity No orthopnea  No palpitations No paroxysmal nocturnal dyspnea  No speech difficulty No syncope   Current diet: in general, a "healthy" diet   Current exercise: walking  The 10-year ASCVD risk score Mikey Bussing DC Jr., et al., 2013) is: 10.5%  --------------------------------------------------------------------------------------------------- Vitamin D deficiency, follow-up  Lab Results  Component Value Date   VD25OH 28.7 (L) 03/12/2020   VD25OH 24.3 (L) 12/21/2018   VD25OH 16.4 (L) 12/20/2017   CALCIUM 9.8 03/12/2020   CALCIUM 9.9 08/03/2019  ) Wt Readings from Last 3 Encounters:  03/17/21 164 lb 3.2 oz (74.5 kg)  10/31/20 160 lb (72.6 kg)  06/16/20 159 lb 9.6 oz (72.4 kg)    She was  last seen for vitamin D deficiency 1 year ago.  Management since that visit includes continuing vitamin D supplement. She reports excellent compliance with treatment. She is not having side effects.   Symptoms: No change in energy level No numbness or tingling  No bone pain No unexplained fracture   ---------------------------------------------------------------------------------------------------  Follow up for  osteopenia:  The patient was last seen for this 1 year ago. Changes made at last visit include none. Due for BMD in 2022  -----------------------------------------------------------------------------------------    Patient Active Problem List   Diagnosis Date Noted  . Coronary artery disease 06/16/2020  . Short PR-normal QRS complex syndrome 12/21/2018  . Vitamin D deficiency 12/21/2017  . Osteopenia 01/03/2017  . History of adenomatous polyp of colon   . Abdominal pain 10/29/2015  . Clavicle enlargement 10/29/2015  . History of shingles 10/29/2015  . Idiopathic or primary livedo reticularis 10/29/2015  . Lichen sclerosus 58/06/9832  . Pancreatitis 10/29/2015  . Mammographic calcification 09/10/2013  . Abnormal ECG 08/14/2013  . Benign essential HTN 02/23/2010  . Hypercholesteremia 05/31/2008  . Family history of cancer of digestive system 05/29/2008   Past Medical History:  Diagnosis Date  . History of pancreatitis   . Lichen sclerosus    Family History  Problem Relation Age of Onset  . Cancer Father        stomach cancer  . Cancer Sister        breast cancer  . Colon cancer Sister    Allergies  Allergen Reactions  . Demerol  [Meperidine]   . Codeine Rash       Medications: Outpatient Medications Prior to Visit  Medication Sig  . amLODipine-benazepril (LOTREL) 5-20 MG capsule TAKE 1 CAPSULE BY MOUTH EVERY DAY  . aspirin 81 MG tablet Take 81 mg by mouth every other day.  . cholecalciferol (VITAMIN D3) 25 MCG (1000 UT) tablet Take 1,000 Units by mouth daily.  . Coenzyme Q10 (COQ10) 100 MG CAPS Take 1 capsule by mouth daily.  Marland Kitchen lovastatin (MEVACOR) 20 MG tablet TAKE 1 TABLET BY MOUTH EVERYDAY AT BEDTIME  . spironolactone (ALDACTONE) 25 MG tablet TAKE 1 TABLET BY MOUTH EVERY DAY   No facility-administered medications prior to visit.    Allergies  Allergen Reactions  . Demerol  [Meperidine]   . Codeine Rash    Patient Care Team: Birdie Sons, MD as  PCP - General (Family Medicine)  Review of Systems  All other systems reviewed and are negative.       Objective    Vitals: BP 127/68   Pulse 83   Resp 16   Ht 5\' 2"  (1.575 m)   Wt 164 lb 3.2 oz (74.5 kg)   SpO2 100%   BMI 30.03 kg/m    Physical Exam  General Appearance:    Overweight female. Alert, cooperative, in no acute distress, appears stated age   Head:    Normocephalic, without obvious abnormality, atraumatic  Eyes:    PERRL, conjunctiva/corneas clear, EOM's intact, fundi    benign, both eyes  Ears:    Normal TM's and external ear canals, both ears  Nose:   Nares normal, septum midline, mucosa normal, no drainage    or sinus tenderness  Throat:   Lips, mucosa, and tongue normal; teeth and gums normal  Neck:   Supple, symmetrical, trachea midline, no adenopathy;    thyroid:  no enlargement/tenderness/nodules; no carotid   bruit or JVD  Back:     Symmetric, no curvature, ROM normal,  no CVA tenderness  Lungs:     Clear to auscultation bilaterally, respirations unlabored  Chest Wall:    No tenderness or deformity   Heart:    Normal heart rate. Normal rhythm. No murmurs, rubs, or gallops.   Breast Exam:    normal appearance, no masses or tenderness  Abdomen:     Soft, non-tender, bowel sounds active all four quadrants,    no masses, no organomegaly  Pelvic:    deferred  Extremities:   All extremities are intact. No cyanosis or edema  Pulses:   2+ and symmetric all extremities  Skin:   Skin color, texture, turgor normal, no rashes or lesions  Lymph nodes:   Cervical, supraclavicular, and axillary nodes normal  Neurologic:   CNII-XII intact, normal strength, sensation and reflexes    throughout     Assessment & Plan     1. Annual physical exam   2. Benign essential HTN Well controlled.  Continue current medications.    3. Coronary artery disease involving native heart without angina pectoris, unspecified vessel or lesion type Asymptomatic. Compliant with  medication.  Continue aggressive risk factor modification.   - CBC - Comprehensive metabolic panel - Lipid panel  4. Hypercholesteremia She is tolerating lovastatin well with no adverse effects.    5. Vitamin D deficiency Taking vitamin d supplement consistently.  - VITAMIN D 25 Hydroxy (Vit-D Deficiency, Fractures)  6. Breast cancer screening by mammogram  - MM 3D Screening Breast Bilateral - [place]; Future  7. Colon cancer screening  - Ambulatory referral to gastroenterology for colonoscopy  8. Personal history of colonic polyps  - Ambulatory referral to gastroenterology for colonoscopy  9. Estrogen deficiency, osteopenia  - DG Bone density [place]; Future  10. Prescription for Shingrix. Vaccine not administered in office.   - Zoster Vaccine Adjuvanted [facility]) injection; Inject 0.5 mLs into the muscle once for 1 dose.  Dispense: 0.5 mL; Refill: 0  11. Prescription for Tdap. Vaccine not administered in office.   - Tdap ([employer]) 5-2.5-18.5 LF-MCG/0.5 injection; Inject 0.5 mLs into the muscle once for 1 dose.  Dispense: 0.5 mL; Refill: 0    Recommended that she get Covid booster ASAP. She declined today. She wants to get it with her husband.     The entirety of the information documented in the History of Present Illness, Review of Systems and Physical Exam were personally obtained by me. Portions of this information were initially documented by the CMA and reviewed by me for thoroughness and accuracy.      Lelon Huh, MD  [facility] [phone] (phone) [phone] (fax)  [place]

## 2021-03-18 LAB — COMPREHENSIVE METABOLIC PANEL
ALT: 21 IU/L (ref 0–32)
AST: 21 IU/L (ref 0–40)
Albumin/Globulin Ratio: 1.7 (ref 1.2–2.2)
Albumin: 4.7 g/dL (ref 3.8–4.8)
Alkaline Phosphatase: 92 IU/L (ref 44–121)
BUN/Creatinine Ratio: 16 (ref 12–28)
BUN: 17 mg/dL (ref 8–27)
Bilirubin Total: 1.2 mg/dL (ref 0.0–1.2)
CO2: 22 mmol/L (ref 20–29)
Calcium: 10.1 mg/dL (ref 8.7–10.3)
Chloride: 103 mmol/L (ref 96–106)
Creatinine, Ser: 1.06 mg/dL — ABNORMAL HIGH (ref 0.57–1.00)
Globulin, Total: 2.8 g/dL (ref 1.5–4.5)
Glucose: 103 mg/dL — ABNORMAL HIGH (ref 65–99)
Potassium: 4.6 mmol/L (ref 3.5–5.2)
Sodium: 140 mmol/L (ref 134–144)
Total Protein: 7.5 g/dL (ref 6.0–8.5)
eGFR: 57 mL/min/{1.73_m2} — ABNORMAL LOW (ref >59)

## 2021-03-18 LAB — VITAMIN D 25 HYDROXY (VIT D DEFICIENCY, FRACTURES): Vit D, 25-Hydroxy: 34 ng/mL (ref 30.0–100.0)

## 2021-03-18 LAB — LIPID PANEL
Chol/HDL Ratio: 3.4 ratio (ref 0.0–4.4)
Cholesterol, Total: 185 mg/dL (ref 100–199)
HDL: 54 mg/dL (ref >39)
LDL Chol Calc (NIH): 105 mg/dL — ABNORMAL HIGH (ref 0–99)
Triglycerides: 146 mg/dL (ref 0–149)
VLDL Cholesterol Cal: 26 mg/dL (ref 5–40)

## 2021-03-18 LAB — CBC
Hematocrit: 44.3 % (ref 34.0–46.6)
Hemoglobin: 14.6 g/dL (ref 11.1–15.9)
MCH: 30.6 pg (ref 26.6–33.0)
MCHC: 33 g/dL (ref 31.5–35.7)
MCV: 93 fL (ref 79–97)
Platelets: 274 10*3/uL (ref 150–450)
RBC: 4.77 x10E6/uL (ref 3.77–5.28)
RDW: 12.3 % (ref 11.7–15.4)
WBC: 8.5 10*3/uL (ref 3.4–10.8)

## 2021-03-31 ENCOUNTER — Telehealth: Payer: Self-pay

## 2021-03-31 NOTE — Telephone Encounter (Signed)
Copied from [place] [id number]. Topic: General - Other >> Mar 31, 2021  2:49 PM Pawlus, Brayton Layman A wrote: Reason for CRM: Pt had some questions about getting her bone density test done and also scheduling her mammogram. Please call back.

## 2021-04-01 NOTE — Telephone Encounter (Signed)
Called and spoke with patient she states that when she contacted [place] to set up mammogram they informed her that she can have her BMD test done there instead unless you were going to compare her previous report from 2018 to now?then she would have to report to [place] since she previously had it done there. Patient wants to know if you are going to compare reports? If not she will get imaging done at [facility]

## 2021-04-01 NOTE — Telephone Encounter (Signed)
Patient has been advised. KW 

## 2021-04-01 NOTE — Telephone Encounter (Signed)
It's OK to get it at [facility]. We can compare reports either way.

## 2021-05-15 ENCOUNTER — Other Ambulatory Visit: Payer: Self-pay | Admitting: Family Medicine

## 2021-08-16 ENCOUNTER — Other Ambulatory Visit: Payer: Self-pay | Admitting: Family Medicine

## 2021-08-17 ENCOUNTER — Other Ambulatory Visit: Payer: Self-pay | Admitting: Family Medicine

## 2021-08-19 ENCOUNTER — Other Ambulatory Visit: Payer: Self-pay

## 2021-08-19 ENCOUNTER — Ambulatory Visit (INDEPENDENT_AMBULATORY_CARE_PROVIDER_SITE_OTHER): Payer: Medicare PPO

## 2021-08-19 DIAGNOSIS — Z23 Encounter for immunization: Secondary | ICD-10-CM | POA: Diagnosis not present

## 2022-02-25 ENCOUNTER — Telehealth: Payer: Self-pay

## 2022-02-25 DIAGNOSIS — Z1231 Encounter for screening mammogram for malignant neoplasm of breast: Secondary | ICD-10-CM

## 2022-02-25 DIAGNOSIS — E2839 Other primary ovarian failure: Secondary | ICD-10-CM

## 2022-02-25 NOTE — Telephone Encounter (Signed)
Copied from [place] [id number]. Topic: Appointment Scheduling - Scheduling Inquiry for Clinic ?>> Feb 25, 2022  2:01 PM Valere Dross wrote: ?Reason for CRM: Pt called in to get a order sent over to [facility] for her to get a Mammograph and Bone Density, please advise. ?

## 2022-02-26 NOTE — Telephone Encounter (Signed)
Orders pending in this encounter. Please advise if ok to order.  ? ?Last CPE was 03/17/2021.  ?Has upcoming appt for physical 03/19/2022.  ?

## 2022-03-11 ENCOUNTER — Telehealth: Payer: Self-pay | Admitting: Family Medicine

## 2022-03-11 NOTE — Telephone Encounter (Signed)
Copied from [place] [id number]. Topic: Medicare AWV >> Mar 11, 2022 10:04 AM [name] wrote: Reason for CRM:  Left message for patient to call back and schedule Medicare Annual Wellness Visit (AWV) in office.   If unable to come into the office for AWV,  please offer to do virtually or by telephone.  Last AWV: 03/17/2021  Please schedule at anytime with [facility] Health Advisor.  30 minute appointment for Virtual or phone 45 minute appointment for in office or Initial virtual/phone  Any questions, please contact me at [phone]

## 2022-03-18 ENCOUNTER — Ambulatory Visit (INDEPENDENT_AMBULATORY_CARE_PROVIDER_SITE_OTHER): Payer: Medicare PPO

## 2022-03-18 VITALS — Wt 164.0 lb

## 2022-03-18 DIAGNOSIS — Z0000 Encounter for general adult medical examination without abnormal findings: Secondary | ICD-10-CM

## 2022-03-18 NOTE — Progress Notes (Signed)
I,Roshena L Chambers,acting as a scribe for Lelon Huh, MD.,have documented all relevant documentation on the behalf of Lelon Huh, MD,as directed by  Lelon Huh, MD while in the presence of Lelon Huh, MD.   Complete physical exam   Patient: Dana Harper   DOB: 08/19/1951   71 y.o. Female  MRN: [id number] Visit Date: 03/19/2022  Today's healthcare provider: Lelon Huh, MD   Chief Complaint  Patient presents with   Annual Exam   Hypertension   Hyperlipidemia   Subjective    Daviona Herbert is a 71 y.o. female who presents today for a complete physical exam.  She reports consuming a general diet. Home exercise routine includes walking. She generally feels fairly well. She reports sleeping fairly well. She does have additional problems to discuss today. She has a skin lesion on the back of her right hand that has been there "for awhile". It is raised dry and scaly and sometimes the top of it scrapes off.   Had AWV with [facility] on 03/18/2022.  HPI  Hypertension, follow-up  BP Readings from Last 3 Encounters:  03/19/22 134/67  03/17/21 127/68  10/31/20 (!) 142/84   Wt Readings from Last 3 Encounters:  03/19/22 165 lb (74.8 kg)  03/18/22 164 lb (74.4 kg)  03/17/21 164 lb 3.2 oz (74.5 kg)     She was last seen for hypertension 1  year  ago.  BP at that visit was 127/68. Management since that visit includes continuing same treatment.  She reports good compliance with treatment. She is not having side effects.  She is following a Regular diet. She is exercising. She does not smoke.  Use of agents associated with hypertension: NSAIDS.   Outside blood pressures are 120-130/85. Symptoms: No chest pain No chest pressure  No palpitations No syncope  No dyspnea No orthopnea  No paroxysmal nocturnal dyspnea No lower extremity edema   Pertinent labs Lab Results  Component Value Date   CHOL 185 03/17/2021   HDL 54 03/17/2021   LDLCALC 105 (H)  03/17/2021   TRIG 146 03/17/2021   CHOLHDL 3.4 03/17/2021   Lab Results  Component Value Date   NA 140 03/17/2021   K 4.6 03/17/2021   CREATININE 1.06 (H) 03/17/2021   EGFR 57 (L) 03/17/2021   GLUCOSE 103 (H) 03/17/2021   TSH 0.998 03/12/2020     The 10-year ASCVD risk score (Arnett DK, et al., 2019) is: 13.5%  ---------------------------------------------------------------------------------------------------   Lipid/Cholesterol, Follow-up  Last lipid panel Other pertinent labs  Lab Results  Component Value Date   CHOL 185 03/17/2021   HDL 54 03/17/2021   LDLCALC 105 (H) 03/17/2021   TRIG 146 03/17/2021   CHOLHDL 3.4 03/17/2021   Lab Results  Component Value Date   ALT 21 03/17/2021   AST 21 03/17/2021   PLT 274 03/17/2021   TSH 0.998 03/12/2020     She was last seen for this 1  year  ago.  Management since that visit includes continuing same treatment.  She reports good compliance with treatment. She is not having side effects.   Symptoms: No chest pain No chest pressure/discomfort  No dyspnea No lower extremity edema  No numbness or tingling of extremity No orthopnea  No palpitations No paroxysmal nocturnal dyspnea  No speech difficulty No syncope   Current diet: well balanced Current exercise: walking  The 10-year ASCVD risk score (Arnett DK, et al., 2019) is: 13.5%  ---------------------------------------------------------------------------------------------------   Follow up for  vitamin D deficiency:  The patient was last seen for this 1  year  ago. Changes made at last visit include none; continue same treatment.  She reports good compliance with treatment. She feels that condition is Unchanged. She is not having side effects.  Lab Results  Component Value Date   VD25OH 34.0 03/17/2021    -----------------------------------------------------------------------------------------   Past Medical History:  Diagnosis Date   History of  pancreatitis    Lichen sclerosus    Past Surgical History:  Procedure Laterality Date   ABDOMINAL HYSTERECTOMY  1979   vaginal: partial  hysterectomy. No history of cancerous or precancerous conditions   COLONOSCOPY WITH PROPOFOL N/A 12/02/2015   Procedure: COLONOSCOPY WITH PROPOFOL;  Surgeon: Lucilla Lame, MD;  Location: [facility] ENDOSCOPY;  Service: Endoscopy;  Laterality: N/A;   mammogram screening  08/20/2013   Bi rads cat 4   pap smear  2009   Dr. Bayard Males -[place]   stress echocardiogram  08/31/2013   changes consistent with apical ischemia   Social History   Socioeconomic History   Marital status: Married    Spouse name: Not on file   Number of children: 2   Years of education: Not on file   Highest education level: Some college, no degree  Occupational History   Occupation: Retired  Tobacco Use   Smoking status: Never   Smokeless tobacco: Never  Vaping Use   Vaping Use: Never used  Substance and Sexual Activity   Alcohol use: No   Drug use: No   Sexual activity: Not on file  Other Topics Concern   Not on file  Social History Narrative   Not on file   Social Determinants of Health   Financial Resource Strain: Low Risk    Difficulty of Paying Living Expenses: Not hard at all  Food Insecurity: No Food Insecurity   Worried About Charity fundraiser in the Last Year: Never true   [place] in the Last Year: Never true  Transportation Needs: No Transportation Needs   Lack of Transportation (Medical): No   Lack of Transportation (Non-Medical): No  Physical Activity: Sufficiently Active   Days of Exercise per Week: 4 days   Minutes of Exercise per Session: 40 min  Stress: No Stress Concern Present   Feeling of Stress : Not at all  Social Connections: Moderately Integrated   Frequency of Communication with Friends and Family: More than three times a week   Frequency of Social Gatherings with Friends and Family: More than three times a week   Attends Religious  Services: More than 4 times per year   Active Member of [employer] or Organizations: No   Attends Archivist Meetings: Never   Marital Status: Married  Human resources officer Violence: Not At Risk   Fear of Current or Ex-Partner: No   Emotionally Abused: No   Physically Abused: No   Sexually Abused: No   Family Status  Relation Name Status   Father  Deceased at age 46   Sister  Alive   Mother  Deceased at age 61       Sepsis   Family History  Problem Relation Age of Onset   Cancer Father        stomach cancer   Cancer Sister        breast cancer   Colon cancer Sister    Allergies  Allergen Reactions   Demerol  [Meperidine]    Codeine Rash    Patient Care  Team: Birdie Sons, MD as PCP - General (Family Medicine)   Medications: Outpatient Medications Prior to Visit  Medication Sig   amLODipine-benazepril (LOTREL) 5-20 MG capsule TAKE 1 CAPSULE BY MOUTH EVERY DAY   aspirin 81 MG tablet Take 81 mg by mouth every other day.   cholecalciferol (VITAMIN D3) 25 MCG (1000 UT) tablet Take 1,000 Units by mouth daily.   Coenzyme Q10 (COQ10) 100 MG CAPS Take 1 capsule by mouth daily.   lovastatin (MEVACOR) 20 MG tablet TAKE 1 TABLET BY MOUTH EVERYDAY AT BEDTIME   spironolactone (ALDACTONE) 25 MG tablet TAKE 1 TABLET BY MOUTH EVERY DAY   No facility-administered medications prior to visit.       Objective     BP 134/67 (BP Location: Left Arm, Patient Position: Sitting, Cuff Size: Large)   Pulse 77   Temp 98 F (36.7 C) (Oral)   Resp 12   Ht _0  (1.575 m)   Wt 165 lb (74.8 kg)   SpO2 100% Comment: room air  BMI 30.18 kg/m     Physical Exam   General Appearance:    Mildly obese female. Alert, cooperative, in no acute distress, appears stated age   Head:    Normocephalic, without obvious abnormality, atraumatic  Eyes:    PERRL, conjunctiva/corneas clear, EOM's intact, fundi    benign, both eyes  Ears:    Normal TM's and external ear canals, both ears  Nose:    Nares normal, septum midline, mucosa normal, no drainage    or sinus tenderness  Throat:   Lips, mucosa, and tongue normal; teeth and gums normal  Neck:   Supple, symmetrical, trachea midline, no adenopathy;    thyroid:  no enlargement/tenderness/nodules; no carotid   bruit or JVD  Back:     Symmetric, no curvature, ROM normal, no CVA tenderness  Lungs:     Clear to auscultation bilaterally, respirations unlabored  Chest Wall:    No tenderness or deformity   Heart:    Normal heart rate. Normal rhythm. No murmurs, rubs, or gallops.   Breast Exam:    normal appearance, no masses or tenderness, deferred  Abdomen:     Soft, non-tender, bowel sounds active all four quadrants,    no masses, no organomegaly  Pelvic:    deferred and not indicated; post-menopausal, no abnormal Pap smears in past  Extremities:   All extremities are intact. No cyanosis or edema  Pulses:   2+ and symmetric all extremities  Skin:   Skin color, texture, turgor normal. Approx 1cm round raised flesh colored scaly lesion on dorsum of right hand, with central indentation. Mild scaly erythema of upper cheeks c/w rosacea.   Lymph nodes:   Cervical, supraclavicular, and axillary nodes normal  Neurologic:   CNII-XII intact, normal strength, sensation and reflexes    throughout     Last depression screening scores    03/18/2022    2:57 PM 03/17/2021    9:03 AM 03/11/2020    2:10 PM  PHQ 2/9 Scores  PHQ - 2 Score 0 0 0  PHQ- 9 Score  0    Last fall risk screening    03/18/2022    3:00 PM  [place] in the past year? 0  Number falls in past yr: 0  Injury with Fall? 0  Risk for fall due to : No Fall Risks  Follow up Falls evaluation completed   Last Audit-C alcohol use screening    03/18/2022  2:57 PM  Alcohol Use Disorder Test (AUDIT)  1. How often do you have a drink containing alcohol? 0  2. How many drinks containing alcohol do you have on a typical day when you are drinking? 0  3. How often do you  have six or more drinks on one occasion? 0  AUDIT-C Score 0   A score of 3 or more in women, and 4 or more in men indicates increased risk for alcohol abuse, EXCEPT if all of the points are from question 1   No results found for any visits on 03/19/22.  Assessment & Plan    Routine Health Maintenance and Physical Exam  Exercise Activities and Dietary recommendations  Goals      DIET - EAT MORE FRUITS AND VEGETABLES     Weight (lb) < 140 lb (63.5 kg)     Starting 12/17/16, I will work to lose 25 pounds this year.   12/20/17, recommend to continue diet and exercise to aid in weight loss goal.          Immunization History  Administered Date(s) Administered   Fluad Quad(high Dose 65+) 08/03/2019, 08/06/2020, 08/19/2021   Influenza Split 10/22/2010   Influenza, High Dose Seasonal PF 09/05/2018   Influenza,inj,Quad PF,6+ Mos 08/14/2013, 10/30/2015   Influenza-Unspecified 08/13/2016, 08/12/2017   Moderna Sars-Covid-2 Vaccination 11/20/2019, 12/18/2019   Pneumococcal Conjugate-13 12/20/2017   Pneumococcal Polysaccharide-23 12/21/2018   Tdap 05/29/2008   Zoster, Live 07/10/2014    Health Maintenance  Topic Date Due   Zoster Vaccines- Shingrix (1 of 2) Never done   TETANUS/TDAP  05/29/2018   DEXA SCAN  01/01/2020   COVID-19 Vaccine (3 - Booster for Moderna series) 02/12/2020   COLONOSCOPY (Pts 45-35yr Insurance coverage will need to be confirmed)  12/01/2020   MAMMOGRAM  02/19/2021   INFLUENZA VACCINE  05/25/2022   Pneumonia Vaccine 71 Years old  Completed   Hepatitis C Screening  Completed   HPV VACCINES  Aged Out    Discussed health benefits of physical activity, and encouraged her to engage in regular exercise appropriate for her age and condition.  1. Hypercholesteremia She is tolerating lovastatin well with no adverse effects.   - CBC - Lipid panel  2. Vitamin D deficiency  - VITAMIN D 25 Hydroxy (Vit-D Deficiency, Fractures)  3. History of adenomatous  polyp of colon 7 year follow up colonoscopy is due next year.   4. Prescription for Shingrix. Vaccine not administered in office.   - Zoster Vaccine Adjuvanted ([facility] injection; Inject 0.5 mLs into the muscle once for 1 dose. Repeat after 2 months  Dispense: 0.5 mL; Refill: 0  5. Prescription for Tdap. Vaccine not administered in office.   - Tdap ([place] 5-2.5-18.5 LF-MCG/0.5 injection; Inject 0.5 mLs into the muscle once for 1 dose.  Dispense: 0.5 mL; Refill: 0  6. Primary hypertension Well controlled.  Continue current medications.   - Comprehensive metabolic panel - EKG [id number] 7. Rosacea She reports metronidazole has worked well for this.  - metroNIDAZOLE (METROGEL) 1 % gel; Apply topically daily.  Dispense: 45 g; Refill: 1  8. Skin lesion of hand  - Ambulatory referral to Dermatology     The entirety of the information documented in the History of Present Illness, Review of Systems and Physical Exam were personally obtained by me. Portions of this information were initially documented by the CMA and reviewed by me for thoroughness and accuracy.     DLelon Huh MD  [facility]  [phone] (phone) [phone] (fax)  [place]

## 2022-03-18 NOTE — Progress Notes (Signed)
Virtual Visit via Telephone Note  I connected with  Dana Harper on 03/18/22 at  3:00 PM EDT by telephone and verified that I am speaking with the correct person using two identifiers.  Location: Patient: home Provider: [facility] Persons participating in the virtual visit: [place]   I discussed the limitations, risks, security and privacy concerns of performing an evaluation and management service by telephone and the availability of in person appointments. The patient expressed understanding and agreed to proceed.  Interactive audio and video telecommunications were attempted between this nurse and patient, however failed, due to patient having technical difficulties OR patient did not have access to video capability.  We continued and completed visit with audio only.  Some vital signs may be absent or patient reported.   Dana David, LPN  Subjective:   Dana Harper is a 71 y.o. female who presents for Medicare Annual (Subsequent) preventive examination.  Review of Systems           Objective:    There were no vitals filed for this visit. There is no height or weight on file to calculate BMI.     03/11/2020    2:13 PM 12/20/2017    8:33 AM 12/17/2016    9:37 AM 12/02/2015    7:01 AM  Advanced Directives  Does Patient Have a Medical Advance Directive? No No No No  Would patient like information on creating a medical advance directive? No - Patient declined No - Patient declined No - Patient declined No - patient declined information    Current Medications (verified) Outpatient Encounter Medications as of 03/18/2022  Medication Sig   amLODipine-benazepril (LOTREL) 5-20 MG capsule TAKE 1 CAPSULE BY MOUTH EVERY DAY   aspirin 81 MG tablet Take 81 mg by mouth every other day.   cholecalciferol (VITAMIN D3) 25 MCG (1000 UT) tablet Take 1,000 Units by mouth daily.   Coenzyme Q10 (COQ10) 100 MG CAPS Take 1 capsule by mouth daily.   lovastatin  (MEVACOR) 20 MG tablet TAKE 1 TABLET BY MOUTH EVERYDAY AT BEDTIME   spironolactone (ALDACTONE) 25 MG tablet TAKE 1 TABLET BY MOUTH EVERY DAY   No facility-administered encounter medications on file as of 03/18/2022.    Allergies (verified) Demerol  [meperidine] and Codeine   History: Past Medical History:  Diagnosis Date   History of pancreatitis    Lichen sclerosus    Past Surgical History:  Procedure Laterality Date   ABDOMINAL HYSTERECTOMY  1979   vaginal: partial  hysterectomy. No history of cancerous or precancerous conditions   COLONOSCOPY WITH PROPOFOL N/A 12/02/2015   Procedure: COLONOSCOPY WITH PROPOFOL;  Surgeon: Lucilla Lame, MD;  Location: [facility] ENDOSCOPY;  Service: Endoscopy;  Laterality: N/A;   mammogram screening  08/20/2013   Bi rads cat 4   pap smear  2009   Dr. Bayard Males -[place] [facility]   stress echocardiogram  08/31/2013   changes consistent with apical ischemia   Family History  Problem Relation Age of Onset   Cancer Father        stomach cancer   Cancer Sister        breast cancer   Colon cancer Sister    Social History   Socioeconomic History   Marital status: Married    Spouse name: Not on file   Number of children: 2   Years of education: Not on file   Highest education level: Some college, no degree  Occupational History   Occupation: Retired  Tobacco Use  Smoking status: Never   Smokeless tobacco: Never  Vaping Use   Vaping Use: Never used  Substance and Sexual Activity   Alcohol use: No   Drug use: No   Sexual activity: Not on file  Other Topics Concern   Not on file  Social History Narrative   Not on file   Social Determinants of Health   Financial Resource Strain: Not on file  Food Insecurity: Not on file  Transportation Needs: Not on file  Physical Activity: Not on file  Stress: Not on file  Social Connections: Not on file    Tobacco Counseling Counseling given: Not Answered   Clinical Intake:  Pre-visit preparation completed:  Yes  Pain : No/denies pain     Diabetes: No  How often do you need to have someone help you when you read instructions, pamphlets, or other written materials from your doctor or pharmacy?: 1 - Never  Diabetic?no  Interpreter Needed?: No  Information entered by :: Kirke Shaggy, LPN   Activities of Daily Living     View : No data to display.          Patient Care Team: Birdie Sons, MD as PCP - General (Family Medicine)  Indicate any recent Medical Services you may have received from other than [facility] providers in the past year (date may be approximate).     Assessment:   This is a routine wellness examination for [place].  Hearing/Vision screen No results found.  Dietary issues and exercise activities discussed:     Goals Addressed   None    Depression Screen    03/17/2021    9:03 AM 03/11/2020    2:10 PM 12/21/2018   10:08 AM 12/20/2017    8:40 AM 12/20/2017    8:33 AM 12/17/2016    9:37 AM 10/30/2015    9:15 AM  PHQ 2/9 Scores  PHQ - 2 Score 0 0 0 0 0 0 0  PHQ- 9 Score 0  0 0   0    Fall Risk    03/17/2021    9:03 AM 03/11/2020    2:13 PM 12/21/2018   10:08 AM 12/20/2017    8:33 AM 12/17/2016    9:37 AM  Fall Risk   Falls in the past year? 0 0 0 No No  Number falls in past yr: 0 0 0    Injury with Fall? 0 0 1    Follow up   Falls evaluation completed      FALL RISK PREVENTION PERTAINING TO THE HOME:  Any stairs in or around the home? Yes  If so, are there any without handrails? No  Home free of loose throw rugs in walkways, pet beds, electrical cords, etc? Yes  Adequate lighting in your home to reduce risk of falls? Yes   ASSISTIVE DEVICES UTILIZED TO PREVENT FALLS:  Life alert? No  Use of a cane, walker or w/c? No  Grab bars in the bathroom? Yes  Shower chair or bench in shower? No  Elevated toilet seat or a handicapped toilet? Yes   Cognitive Function: 0 points, 6CIT        12/17/2016    9:41 AM  6CIT Screen  What Year? 0 points   What month? 0 points  What time? 0 points  Count back from 20 0 points  Months in reverse 0 points  Repeat phrase 2 points  Total Score 2 points    Immunizations Immunization History  Administered Date(s) Administered  Fluad Quad(high Dose 65+) 08/03/2019, 08/06/2020, 08/19/2021   Influenza Split 10/22/2010   Influenza, High Dose Seasonal PF 09/05/2018   Influenza,inj,Quad PF,6+ Mos 08/14/2013, 10/30/2015   Influenza-Unspecified 08/13/2016, 08/12/2017   Moderna Sars-Covid-2 Vaccination 11/20/2019, 12/18/2019   Pneumococcal Conjugate-13 12/20/2017   Pneumococcal Polysaccharide-23 12/21/2018   Tdap 05/29/2008   Zoster, Live 07/10/2014    TDAP status: Due, Education has been provided regarding the importance of this vaccine. Advised may receive this vaccine at local pharmacy or Health Dept. Aware to provide a copy of the vaccination record if obtained from local pharmacy or Health Dept. Verbalized acceptance and understanding.  Flu Vaccine status: Up to date  Pneumococcal vaccine status: Up to date  Covid-19 vaccine status: Completed vaccines  Qualifies for Shingles Vaccine? Yes   Zostavax completed Yes   Shingrix Completed?: No.    Education has been provided regarding the importance of this vaccine. Patient has been advised to call insurance company to determine out of pocket expense if they have not yet received this vaccine. Advised may also receive vaccine at local pharmacy or Health Dept. Verbalized acceptance and understanding.  Screening Tests Health Maintenance  Topic Date Due   Zoster Vaccines- Shingrix (1 of 2) Never done   TETANUS/TDAP  05/29/2018   DEXA SCAN  01/01/2020   COVID-19 Vaccine (3 - Booster for Moderna series) 02/12/2020   COLONOSCOPY (Pts 45-57yr Insurance coverage will need to be confirmed)  12/01/2020   MAMMOGRAM  02/19/2021   INFLUENZA VACCINE  05/25/2022   Pneumonia Vaccine 71 Years old  Completed   Hepatitis C Screening  Completed   HPV  VACCINES  Aged Out    Health Maintenance  Health Maintenance Due  Topic Date Due   Zoster Vaccines- Shingrix (1 of 2) Never done   TETANUS/TDAP  05/29/2018   DEXA SCAN  01/01/2020   COVID-19 Vaccine (3 - Booster for Moderna series) 02/12/2020   COLONOSCOPY (Pts 45-4110yrInsurance coverage will need to be confirmed)  12/01/2020   MAMMOGRAM  02/19/2021    Colorectal cancer screening: Type of screening: Colonoscopy. Completed 12/02/15. Repeat every 10 years  Mammogram status: Completed has appointment 6/12. Repeat every year  Bone Density status: Completed has appointment 6/12. Results reflect: Bone density results: NORMAL. Repeat every 5 years.  Lung Cancer Screening: (Low Dose CT Chest recommended if Age 71-80ears, 30 pack-year currently smoking OR have quit w/in 15years.) does not qualify.   Additional Screening:  Hepatitis C Screening: does qualify; Completed 04/17/12  Vision Screening: Recommended annual ophthalmology exams for early detection of glaucoma and other disorders of the eye. Is the patient up to date with their annual eye exam?  Yes  Who is the provider or what is the name of the office in which the patient attends annual eye exams? Dr.Shade If pt is not established with a provider, would they like to be referred to a provider to establish care? No .   Dental Screening: Recommended annual dental exams for proper oral hygiene  Community Resource Referral / Chronic Care Management: CRR required this visit?  No   CCM required this visit?  No      Plan:     I have personally reviewed and noted the following in the patient's chart:   Medical and social history Use of alcohol, tobacco or illicit drugs  Current medications and supplements including opioid prescriptions.  Functional ability and status Nutritional status Physical activity Advanced directives List of other physicians Hospitalizations, surgeries, and ER visits in  previous 12  months Vitals Screenings to include cognitive, depression, and falls Referrals and appointments  In addition, I have reviewed and discussed with patient certain preventive protocols, quality metrics, and best practice recommendations. A written personalized care plan for preventive services as well as general preventive health recommendations were provided to patient.     Dana David, LPN   [id number]   Nurse Notes: none

## 2022-03-18 NOTE — Patient Instructions (Signed)
Dana Harper , Thank you for taking time to come for your Medicare Wellness Visit. I appreciate your ongoing commitment to your health goals. Please review the following plan we discussed and let me know if I can assist you in the future.   Screening recommendations/referrals: Colonoscopy: 12/02/15 Mammogram: has appointment 6/12 Bone Density: has appointment 6/12 Recommended yearly ophthalmology/optometry visit for glaucoma screening and checkup Recommended yearly dental visit for hygiene and checkup  Vaccinations: Influenza vaccine: 08/19/21 Pneumococcal vaccine: 12/21/18 Tdap vaccine: 05/29/08, due Shingles vaccine: Zostavax 07/10/14   Covid-19:11/20/19, 12/18/19  Advanced directives: no  Conditions/risks identified: none  Next appointment: Follow up in one year for your annual wellness visit 03/22/23 @ 11:15 am by phone   Preventive Care 65 Years and Older, Female Preventive care refers to lifestyle choices and visits with your health care provider that can promote health and wellness. What does preventive care include? A yearly physical exam. This is also called an annual well check. Dental exams once or twice a year. Routine eye exams. Ask your health care provider how often you should have your eyes checked. Personal lifestyle choices, including: Daily care of your teeth and gums. Regular physical activity. Eating a healthy diet. Avoiding tobacco and drug use. Limiting alcohol use. Practicing safe sex. Taking low-dose aspirin every day. Taking vitamin and mineral supplements as recommended by your health care provider. What happens during an annual well check? The services and screenings done by your health care provider during your annual well check will depend on your age, overall health, lifestyle risk factors, and family history of disease. Counseling  Your health care provider may ask you questions about your: Alcohol use. Tobacco use. Drug use. Emotional  well-being. Home and relationship well-being. Sexual activity. Eating habits. History of falls. Memory and ability to understand (cognition). Work and work Statistician. Reproductive health. Screening  You may have the following tests or measurements: Height, weight, and BMI. Blood pressure. Lipid and cholesterol levels. These may be checked every 5 years, or more frequently if you are over 88 years old. Skin check. Lung cancer screening. You may have this screening every year starting at age 33 if you have a 30-pack-year history of smoking and currently smoke or have quit within the past 15 years. Fecal occult blood test (FOBT) of the stool. You may have this test every year starting at age 62. Flexible sigmoidoscopy or colonoscopy. You may have a sigmoidoscopy every 5 years or a colonoscopy every 10 years starting at age 15. Hepatitis C blood test. Hepatitis B blood test. Sexually transmitted disease (STD) testing. Diabetes screening. This is done by checking your blood sugar (glucose) after you have not eaten for a while (fasting). You may have this done every 1-3 years. Bone density scan. This is done to screen for osteoporosis. You may have this done starting at age 55. Mammogram. This may be done every 1-2 years. Talk to your health care provider about how often you should have regular mammograms. Talk with your health care provider about your test results, treatment options, and if necessary, the need for more tests. Vaccines  Your health care provider may recommend certain vaccines, such as: Influenza vaccine. This is recommended every year. Tetanus, diphtheria, and acellular pertussis (Tdap, Td) vaccine. You may need a Td booster every 10 years. Zoster vaccine. You may need this after age 72. Pneumococcal 13-valent conjugate (PCV13) vaccine. One dose is recommended after age 9. Pneumococcal polysaccharide (PPSV23) vaccine. One dose is recommended after age 53.  Talk to your  health care provider about which screenings and vaccines you need and how often you need them. This information is not intended to replace advice given to you by your health care provider. Make sure you discuss any questions you have with your health care provider. Document Released: 11/07/2015 Document Revised: 06/30/2016 Document Reviewed: 08/12/2015 Elsevier Interactive Patient Education  2017 [employer] Prevention in the Home Falls can cause injuries. They can happen to people of all ages. There are many things you can do to make your home safe and to help prevent falls. What can I do on the outside of my home? Regularly fix the edges of walkways and driveways and fix any cracks. Remove anything that might make you trip as you walk through a door, such as a raised step or threshold. Trim any bushes or trees on the path to your home. Use bright outdoor lighting. Clear any walking paths of anything that might make someone trip, such as rocks or tools. Regularly check to see if handrails are loose or broken. Make sure that both sides of any steps have handrails. Any raised decks and porches should have guardrails on the edges. Have any leaves, snow, or ice cleared regularly. Use sand or salt on walking paths during winter. Clean up any spills in your garage right away. This includes oil or grease spills. What can I do in the bathroom? Use night lights. Install grab bars by the toilet and in the tub and shower. Do not use towel bars as grab bars. Use non-skid mats or decals in the tub or shower. If you need to sit down in the shower, use a plastic, non-slip stool. Keep the floor dry. Clean up any water that spills on the floor as soon as it happens. Remove soap buildup in the tub or shower regularly. Attach bath mats securely with double-sided non-slip rug tape. Do not have throw rugs and other things on the floor that can make you trip. What can I do in the bedroom? Use night  lights. Make sure that you have a light by your bed that is easy to reach. Do not use any sheets or blankets that are too big for your bed. They should not hang down onto the floor. Have a firm chair that has side arms. You can use this for support while you get dressed. Do not have throw rugs and other things on the floor that can make you trip. What can I do in the kitchen? Clean up any spills right away. Avoid walking on wet floors. Keep items that you use a lot in easy-to-reach places. If you need to reach something above you, use a strong step stool that has a grab bar. Keep electrical cords out of the way. Do not use floor polish or wax that makes floors slippery. If you must use wax, use non-skid floor wax. Do not have throw rugs and other things on the floor that can make you trip. What can I do with my stairs? Do not leave any items on the stairs. Make sure that there are handrails on both sides of the stairs and use them. Fix handrails that are broken or loose. Make sure that handrails are as long as the stairways. Check any carpeting to make sure that it is firmly attached to the stairs. Fix any carpet that is loose or worn. Avoid having throw rugs at the top or bottom of the stairs. If you do have throw  rugs, attach them to the floor with carpet tape. Make sure that you have a light switch at the top of the stairs and the bottom of the stairs. If you do not have them, ask someone to add them for you. What else can I do to help prevent falls? Wear shoes that: Do not have high heels. Have rubber bottoms. Are comfortable and fit you well. Are closed at the toe. Do not wear sandals. If you use a stepladder: Make sure that it is fully opened. Do not climb a closed stepladder. Make sure that both sides of the stepladder are locked into place. Ask someone to hold it for you, if possible. Clearly mark and make sure that you can see: Any grab bars or handrails. First and last  steps. Where the edge of each step is. Use tools that help you move around (mobility aids) if they are needed. These include: Canes. Walkers. Scooters. Crutches. Turn on the lights when you go into a dark area. Replace any light bulbs as soon as they burn out. Set up your furniture so you have a clear path. Avoid moving your furniture around. If any of your floors are uneven, fix them. If there are any pets around you, be aware of where they are. Review your medicines with your doctor. Some medicines can make you feel dizzy. This can increase your chance of falling. Ask your doctor what other things that you can do to help prevent falls. This information is not intended to replace advice given to you by your health care provider. Make sure you discuss any questions you have with your health care provider. Document Released: 08/07/2009 Document Revised: 03/18/2016 Document Reviewed: 11/15/2014 Elsevier Interactive Patient Education  2017 [employer].

## 2022-03-19 ENCOUNTER — Encounter: Payer: Self-pay | Admitting: Family Medicine

## 2022-03-19 ENCOUNTER — Ambulatory Visit (INDEPENDENT_AMBULATORY_CARE_PROVIDER_SITE_OTHER): Payer: Medicare PPO | Admitting: Family Medicine

## 2022-03-19 VITALS — BP 134/67 | HR 77 | Temp 98.0°F | Resp 12 | Ht 62.0 in | Wt 165.0 lb

## 2022-03-19 DIAGNOSIS — E7800 Pure hypercholesterolemia, unspecified: Secondary | ICD-10-CM | POA: Diagnosis not present

## 2022-03-19 DIAGNOSIS — Z23 Encounter for immunization: Secondary | ICD-10-CM | POA: Diagnosis not present

## 2022-03-19 DIAGNOSIS — Z86010 Personal history of colonic polyps: Secondary | ICD-10-CM

## 2022-03-19 DIAGNOSIS — E559 Vitamin D deficiency, unspecified: Secondary | ICD-10-CM

## 2022-03-19 DIAGNOSIS — L989 Disorder of the skin and subcutaneous tissue, unspecified: Secondary | ICD-10-CM

## 2022-03-19 DIAGNOSIS — L719 Rosacea, unspecified: Secondary | ICD-10-CM | POA: Diagnosis not present

## 2022-03-19 DIAGNOSIS — Z289 Immunization not carried out for unspecified reason: Secondary | ICD-10-CM | POA: Diagnosis not present

## 2022-03-19 DIAGNOSIS — I10 Essential (primary) hypertension: Secondary | ICD-10-CM | POA: Diagnosis not present

## 2022-03-19 MED ORDER — TETANUS-DIPHTH-ACELL PERTUSSIS 5-2.5-18.5 LF-MCG/0.5 IM SUSY: 0.5000 mL | PREFILLED_SYRINGE | Freq: Once | INTRAMUSCULAR | 0 refills | Status: AC

## 2022-03-19 MED ORDER — METRONIDAZOLE 1 % EX GEL: Freq: Every day | CUTANEOUS | 1 refills | Status: AC

## 2022-03-19 MED ORDER — SHINGRIX 50 MCG/0.5ML IM SUSR: 0.5000 mL | Freq: Once | INTRAMUSCULAR | 0 refills | Status: AC

## 2022-03-19 NOTE — Patient Instructions (Signed)
Please review the attached list of medications and notify my office if there are any errors.   You are due for a Tdap (tetanus-diptheria-pertussis vaccine) which protects you from tetanus and whooping cough. Please check with your insurance plan or pharmacy regarding coverage for this vaccine.   The CDC recommends two doses of Shingrix (the shingles vaccine) separated by 2 to 6 months for adults age 71 years and older. I recommend checking with your insurance plan regarding coverage for this vaccine.   

## 2022-03-20 LAB — CBC
Hematocrit: 40.7 % (ref 34.0–46.6)
Hemoglobin: 14.1 g/dL (ref 11.1–15.9)
MCH: 31.3 pg (ref 26.6–33.0)
MCHC: 34.6 g/dL (ref 31.5–35.7)
MCV: 90 fL (ref 79–97)
Platelets: 279 10*3/uL (ref 150–450)
RBC: 4.51 x10E6/uL (ref 3.77–5.28)
RDW: 12.7 % (ref 11.7–15.4)
WBC: 8.7 10*3/uL (ref 3.4–10.8)

## 2022-03-20 LAB — COMPREHENSIVE METABOLIC PANEL
ALT: 19 IU/L (ref 0–32)
AST: 24 IU/L (ref 0–40)
Albumin/Globulin Ratio: 2 (ref 1.2–2.2)
Albumin: 4.8 g/dL (ref 3.8–4.8)
Alkaline Phosphatase: 88 IU/L (ref 44–121)
BUN/Creatinine Ratio: 17 (ref 12–28)
BUN: 19 mg/dL (ref 8–27)
Bilirubin Total: 1.1 mg/dL (ref 0.0–1.2)
CO2: 21 mmol/L (ref 20–29)
Calcium: 9.7 mg/dL (ref 8.7–10.3)
Chloride: 102 mmol/L (ref 96–106)
Creatinine, Ser: 1.15 mg/dL — ABNORMAL HIGH (ref 0.57–1.00)
Globulin, Total: 2.4 g/dL (ref 1.5–4.5)
Glucose: 109 mg/dL — ABNORMAL HIGH (ref 70–99)
Potassium: 4.8 mmol/L (ref 3.5–5.2)
Sodium: 138 mmol/L (ref 134–144)
Total Protein: 7.2 g/dL (ref 6.0–8.5)
eGFR: 51 mL/min/{1.73_m2} — ABNORMAL LOW (ref >59)

## 2022-03-20 LAB — VITAMIN D 25 HYDROXY (VIT D DEFICIENCY, FRACTURES): Vit D, 25-Hydroxy: 37.6 ng/mL (ref 30.0–100.0)

## 2022-03-20 LAB — LIPID PANEL
Chol/HDL Ratio: 3.3 ratio (ref 0.0–4.4)
Cholesterol, Total: 182 mg/dL (ref 100–199)
HDL: 55 mg/dL (ref >39)
LDL Chol Calc (NIH): 109 mg/dL — ABNORMAL HIGH (ref 0–99)
Triglycerides: 101 mg/dL (ref 0–149)
VLDL Cholesterol Cal: 18 mg/dL (ref 5–40)

## 2022-04-05 ENCOUNTER — Other Ambulatory Visit: Payer: BC Managed Care – PPO

## 2022-04-15 ENCOUNTER — Ambulatory Visit
Admission: RE | Admit: 2022-04-15 | Discharge: 2022-04-15 | Disposition: A | Discharge status: Home / Self Care | Payer: Medicare PPO | Source: Ambulatory Visit | Attending: Family Medicine | Admitting: Family Medicine

## 2022-04-15 DIAGNOSIS — Z1231 Encounter for screening mammogram for malignant neoplasm of breast: Secondary | ICD-10-CM | POA: Insufficient documentation

## 2022-04-20 ENCOUNTER — Inpatient Hospital Stay
Admission: RE | Admit: 2022-04-20 | Discharge: 2022-04-20 | Disposition: A | Discharge status: Home / Self Care | Payer: Self-pay | Source: Ambulatory Visit | Attending: *Deleted | Admitting: *Deleted

## 2022-04-20 ENCOUNTER — Other Ambulatory Visit: Payer: Self-pay | Admitting: *Deleted

## 2022-04-20 DIAGNOSIS — Z1231 Encounter for screening mammogram for malignant neoplasm of breast: Secondary | ICD-10-CM

## 2022-05-20 ENCOUNTER — Ambulatory Visit
Admission: RE | Admit: 2022-05-20 | Discharge: 2022-05-20 | Disposition: A | Discharge status: Home / Self Care | Payer: Medicare PPO | Source: Ambulatory Visit | Attending: Family Medicine | Admitting: Family Medicine

## 2022-05-20 DIAGNOSIS — M8589 Other specified disorders of bone density and structure, multiple sites: Secondary | ICD-10-CM | POA: Diagnosis not present

## 2022-05-20 DIAGNOSIS — E2839 Other primary ovarian failure: Secondary | ICD-10-CM | POA: Insufficient documentation

## 2022-05-20 DIAGNOSIS — Z780 Asymptomatic menopausal state: Secondary | ICD-10-CM | POA: Diagnosis not present

## 2022-05-25 ENCOUNTER — Telehealth: Payer: Self-pay

## 2022-05-25 NOTE — Telephone Encounter (Signed)
Copied from [place] [id number]. Topic: General - Other >> May 25, 2022  2:25 PM Chapman Fitch wrote: Reason for CRM: Pt has a question for Roshena / please call pt back

## 2022-05-27 ENCOUNTER — Other Ambulatory Visit: Payer: BC Managed Care – PPO

## 2022-07-27 DIAGNOSIS — L570 Actinic keratosis: Secondary | ICD-10-CM | POA: Diagnosis not present

## 2022-07-27 DIAGNOSIS — L821 Other seborrheic keratosis: Secondary | ICD-10-CM | POA: Diagnosis not present

## 2022-07-27 DIAGNOSIS — L298 Other pruritus: Secondary | ICD-10-CM | POA: Diagnosis not present

## 2022-09-10 ENCOUNTER — Ambulatory Visit (INDEPENDENT_AMBULATORY_CARE_PROVIDER_SITE_OTHER): Payer: Medicare PPO | Admitting: Physician Assistant

## 2022-09-10 DIAGNOSIS — Z23 Encounter for immunization: Secondary | ICD-10-CM | POA: Diagnosis not present

## 2022-10-26 ENCOUNTER — Encounter: Payer: Self-pay | Admitting: Family Medicine

## 2022-10-26 ENCOUNTER — Ambulatory Visit: Payer: Medicare PPO | Admitting: Family Medicine

## 2022-10-26 VITALS — BP 127/85 | HR 102 | Temp 99.3°F | Resp 16 | Wt 165.8 lb

## 2022-10-26 DIAGNOSIS — U071 COVID-19: Secondary | ICD-10-CM | POA: Diagnosis not present

## 2022-10-26 DIAGNOSIS — J069 Acute upper respiratory infection, unspecified: Secondary | ICD-10-CM

## 2022-10-26 LAB — POC COVID19 BINAXNOW: SARS Coronavirus 2 Ag: POSITIVE — AB

## 2022-10-26 LAB — POCT INFLUENZA A/B
Influenza A, POC: NEGATIVE
Influenza B, POC: NEGATIVE

## 2022-10-26 MED ORDER — NIRMATRELVIR/RITONAVIR (PAXLOVID) TABLET (RENAL DOSING): 2.0000 | ORAL_TABLET | Freq: Two times a day (BID) | ORAL | 0 refills | Status: DC

## 2022-10-26 MED ORDER — BENZONATATE 100 MG PO CAPS: 100.0000 mg | ORAL_CAPSULE | Freq: Two times a day (BID) | ORAL | 0 refills | Status: DC | PRN

## 2022-10-26 NOTE — Patient Instructions (Addendum)
Your test for COVID was positive.   Please wait 12 hours after your cholesterol medication before starting this paxlovid   I have also sent a medication to help with coughing   You will need to be seen immediately if you begin to have dizziness, lightheadedness or difficulty breathing   As discussed, please isolate for 5 days in a separate room from your husband and make sure to clean frequently touched surfaces in the home and use a separate bathroom to frequently wash hands.    COVID-19, or coronavirus disease 2019, is an infection that is caused by a new (novel) coronavirus called SARS-CoV-2. COVID-19 can cause many symptoms. In some people, the virus may not cause any symptoms. In others, it may cause mild or severe symptoms. Some people with severe infection develop severe disease. What are the causes? This illness is caused by a virus. The virus may be in the air as tiny specks of fluid (aerosols) or droplets, or it may be on surfaces. You may catch the virus by: Breathing in droplets from an infected person. Droplets can be spread by a person breathing, speaking, singing, coughing, or sneezing. Touching something, like a table or a doorknob, that has virus on it (is contaminated) and then touching your mouth, nose, or eyes. What increases the risk? Risk for infection: You are more likely to get infected with the COVID-19 virus if: You are within 6 ft (1.8 m) of a person with COVID-19 for 15 minutes or longer. You are providing care for a person who is infected with COVID-19. You are in close personal contact with other people. Close personal contact includes hugging, kissing, or sharing eating or drinking utensils. Risk for serious illness caused by COVID-19: You are more likely to get seriously ill from the COVID-19 virus if: You have cancer. You have a long-term (chronic) disease, such as: Chronic lung disease. This includes pulmonary embolism, chronic obstructive pulmonary  disease, and cystic fibrosis. Long-term disease that lowers your body's ability to fight infection (immunocompromise). Serious cardiac conditions, such as heart failure, coronary artery disease, or cardiomyopathy. Diabetes. Chronic kidney disease. Liver diseases. These include cirrhosis, nonalcoholic fatty liver disease, alcoholic liver disease, or autoimmune hepatitis. You have obesity. You are pregnant or were recently pregnant. You have sickle cell disease. What are the signs or symptoms? Symptoms of this condition can range from mild to severe. Symptoms may appear any time from 2 to 14 days after being exposed to the virus. They include: Fever or chills. Shortness of breath or trouble breathing. Feeling tired or very tired. Headaches, body aches, or muscle aches. Runny or stuffy nose, sneezing, coughing, or sore throat. New loss of taste or smell. This is rare. Some people may also have stomach problems, such as nausea, vomiting, or diarrhea. Other people may not have any symptoms of COVID-19. How is this diagnosed? This condition may be diagnosed by testing samples to check for the COVID-19 virus. The most common tests are the PCR test and the antigen test. Tests may be done in the lab or at home. They include: Using a swab to take a sample of fluid from the back of your nose and throat (nasopharyngeal fluid), from your nose, or from your throat. Testing a sample of saliva from your mouth. Testing a sample of coughed-up mucus from your lungs (sputum). How is this treated? Treatment for COVID-19 infection depends on the severity of the condition. Mild symptoms can be managed at home with rest, fluids, and over-the-counter  medicines. Serious symptoms may be treated in a hospital intensive care unit (ICU). Treatment in the ICU may include: Supplemental oxygen. Extra oxygen is given through a tube in the nose, a face mask, or a hood. Medicines. These may include: Antivirals, such as  monoclonal antibodies. These help your body fight off certain viruses that can cause disease. Anti-inflammatories, such as corticosteroids. These reduce inflammation and suppress the immune system. Antithrombotics. These prevent or treat blood clots, if they develop. Convalescent plasma. This helps boost your immune system, if you have an underlying immunosuppressive condition or are getting immunosuppressive treatments. Prone positioning. This means you will lie on your stomach. This helps oxygen to get into your lungs. Infection control measures. If you are at risk for more serious illness caused by COVID-19, your health care provider may prescribe two long-acting monoclonal antibodies, given together every 6 months. How is this prevented? To protect yourself: Use preventive medicine (pre-exposure prophylaxis). You may get pre-exposure prophylaxis if you have moderate or severe immunocompromise. Get vaccinated. Anyone 62 months old or older who meets guidelines can get a COVID-19 vaccine or vaccine series. This includes people who are pregnant or making breast milk (lactating). Get an added dose of COVID-19 vaccine after your first vaccine or vaccine series if you have moderate to severe immunocompromise. This applies if you have had a solid organ transplant or have been diagnosed with an immunocompromising condition. You should get the added dose 4 weeks after you got the first COVID-19 vaccine or vaccine series. If you get an mRNA vaccine, you will need a 3-dose primary series. If you get the J&J/Janssen vaccine, you will need a 2-dose primary series, with the second dose being an mRNA vaccine. Talk to your health care provider about getting experimental monoclonal antibodies. This treatment is approved under emergency use authorization to prevent severe illness before or after being exposed to the COVID-19 virus. You may be given monoclonal antibodies if: You have moderate or severe  immunocompromise. This includes treatments that lower your immune response. People with immunocompromise may not develop protection against COVID-19 when they are vaccinated. You cannot be vaccinated. You may not get a vaccine if you have a severe allergic reaction to the vaccine or its components. You are not fully vaccinated. You are in a facility where COVID-19 is present and: Are in close contact with a person who is infected with the COVID-19 virus. Are at high risk of being exposed to the COVID-19 virus. You are at risk of illness from new variants of the COVID-19 virus. To protect others: If you have symptoms of COVID-19, take steps to prevent the virus from spreading to others. Stay home. Leave your house only to get medical care. Do not use public transit, if possible. Do not travel while you are sick. Wash your hands often with soap and water for at least 20 seconds. If soap and water are not available, use alcohol-based hand sanitizer. Make sure that all people in your household wash their hands well and often. Cough or sneeze into a tissue or your sleeve or elbow. Do not cough or sneeze into your hand or into the air. Where to find more information Centers for Disease Control and Prevention: CharmCourses.be World Health Organization: https://www.castaneda.info/ Get help right away if: You have trouble breathing. You have pain or pressure in your chest. You are confused. You have bluish lips and fingernails. You have trouble waking from sleep. You have symptoms that get worse. These symptoms may be an  emergency. Get help right away. Call 911. Do not wait to see if the symptoms will go away. Do not drive yourself to the hospital. Summary COVID-19 is an infection that is caused by a new coronavirus. Sometimes, there are no symptoms. Other times, symptoms range from mild to severe. Some people with a severe COVID-19 infection develop severe disease. The virus  that causes COVID-19 can spread from person to person through droplets or aerosols from breathing, speaking, singing, coughing, or sneezing. Mild symptoms of COVID-19 can be managed at home with rest, fluids, and over-the-counter medicines. This information is not intended to replace advice given to you by your health care provider. Make sure you discuss any questions you have with your health care provider. Document Revised: 09/29/2021 Document Reviewed: 10/01/2021 Elsevier Patient Education  [place].

## 2022-10-26 NOTE — Progress Notes (Unsigned)
     I,Joseline E Rosas,acting as a scribe for [employer], MD.,have documented all relevant documentation on the behalf of Eulis Foster, MD,as directed by  Eulis Foster, MD while in the presence of Eulis Foster, MD.   Established patient visit   Patient: Dana Harper   DOB: 09/01/1951   72 y.o. Female  MRN: [id number] Visit Date: 10/26/2022  Today's healthcare provider: Eulis Foster, MD   Chief Complaint  Patient presents with   Headache   Subjective    Headache  This is a new problem. The current episode started yesterday. The problem occurs constantly. The problem has been unchanged. The pain is located in the Frontal region. The pain does not radiate. The pain quality is similar to prior headaches. The quality of the pain is described as aching. The pain is at a severity of 5/10. The pain is moderate. Associated symptoms include coughing, drainage, rhinorrhea, scalp tenderness and sinus pressure. Pertinent negatives include no fever, muscle aches, sore throat, swollen glands, visual change or vomiting. Associated symptoms comments: Congestion. Nothing aggravates the symptoms. She has tried acetaminophen (Yesterday she took Claritin) for the symptoms. The treatment provided no relief.    ***  Medications: Outpatient Medications Prior to Visit  Medication Sig   amLODipine-benazepril (LOTREL) 5-20 MG capsule TAKE 1 CAPSULE BY MOUTH EVERY DAY   aspirin 81 MG tablet Take 81 mg by mouth every other day.   cholecalciferol (VITAMIN D3) 25 MCG (1000 UT) tablet Take 1,000 Units by mouth daily.   Coenzyme Q10 (COQ10) 100 MG CAPS Take 1 capsule by mouth daily.   lovastatin (MEVACOR) 20 MG tablet TAKE 1 TABLET BY MOUTH EVERYDAY AT BEDTIME   metroNIDAZOLE (METROGEL) 1 % gel Apply topically daily.   spironolactone (ALDACTONE) 25 MG tablet TAKE 1 TABLET BY MOUTH EVERY DAY   No facility-administered medications prior to visit.     Review of Systems  Constitutional:  Negative for fever.  HENT:  Positive for rhinorrhea and sinus pressure. Negative for sore throat.   Respiratory:  Positive for cough.   Gastrointestinal:  Negative for vomiting.  Neurological:  Positive for headaches.    {Labs  Heme  Chem  Endocrine  Serology  Results Review (optional):23779}   Objective    BP 127/85 (BP Location: Left Arm, Patient Position: Sitting, Cuff Size: Large)   Pulse (!) 102   Temp 99.3 F (37.4 C) (Oral)   Resp 16   Wt 165 lb 12.8 oz (75.2 kg)   BMI 30.33 kg/m  {Show previous vital signs (optional):23777}  Physical Exam  ***  No results found for any visits on 10/26/22.  Assessment & Plan     Problem List Items Addressed This Visit   None    No follow-ups on file.      I, Naranja, CMA, have reviewed all documentation for this visit.  Portions of this information were initially documented by the CMA and reviewed by me for thoroughness and accuracy.      Eulis Foster, MD  [facility] [phone] (phone) [phone] (fax)  [place]

## 2022-10-27 ENCOUNTER — Ambulatory Visit: Payer: Self-pay | Admitting: *Deleted

## 2022-10-27 DIAGNOSIS — U071 COVID-19: Secondary | ICD-10-CM | POA: Insufficient documentation

## 2022-10-27 NOTE — Assessment & Plan Note (Addendum)
+  COVID testing in office  Paxlovid renal dosing prescribed due to last GFR 51 Recommended patient isolate in separate room in a separate restroom from her husband who is cancer survivor Recommended 5 days of isolation See AVS for further symptom management  Discussed ED precautions including SOB, dizziness, lightheadedness  Encouraged frequent oral hydration

## 2022-10-27 NOTE — Telephone Encounter (Signed)
Per agent: "Summary: dx COVID  positive   Patient was seen on 10/26/2022 and states she was dx with COVID.  Patient was prescribed nirmatrelvir/ritonavir, renal dosing, (PAXLOVID) 10 x 150 MG & 10 x '100MG'$  TAB and states she does not feel comfortable taking medication because of the side effects and because of the current medication she is on.  Patent would like PCP to prescribe alternate medication.  Patient experiencing coughing, blowing nose and head ache has improved. Patient has started the cough medication prescribed."      Chief Complaint: Medication Symptoms: NA Frequency: NA Pertinent Negatives: Patient denies any worsening of symptoms Disposition: '[]'$ ED /'[]'$ Urgent Care (no appt availability in office) / '[]'$ Appointment(In office/virtual)/ '[]'$  [place] [facility]/ '[]'$ Home Care/ '[]'$ Refused Recommended Disposition /'[]'$ [place] [facility]/ '[x]'$  Follow-up with PCP Additional Notes: Pt prescribed Paxlovid yesterday. States after reading side effects decided not to take med. Questioning if there is another med she could take for symptoms. Advised alternate meds would only treat symptoms. States tessalon prescribed"Not helping much at all." Assured pt NT would route to practice for PCPs review and final disposition.  Please advise. Reason for Disposition  [1] Caller has URGENT medicine question about med that PCP or specialist prescribed AND [2] triager unable to answer question  Answer Assessment - Initial Assessment Questions 1. NAME of MEDICINE: "What medicine(s) are you calling about?"     PAxlovid 2. QUESTION: "What is your question?" (e.g., double dose of medicine, side effect)   Is there any alternate med? 3. PRESCRIBER: "Who prescribed the medicine?" Reason: if prescribed by specialist, call should be referred to that group.     Dr. Rosita Fire 4. SYMPTOMS: "Do you have any symptoms?" If Yes, ask: "What symptoms are you having?"  "How bad are the symptoms (e.g., mild, moderate,  severe)     *No Answer* 5. PREGNANCY:  "Is there any chance that you are pregnant?" "When was your last menstrual period?"     *No Answer*  Protocols used: Medication Question Call-A-AH

## 2022-10-29 ENCOUNTER — Telehealth: Payer: Self-pay | Admitting: Family Medicine

## 2022-10-29 MED ORDER — MOLNUPIRAVIR EUA 200MG CAPSULE: 4.0000 | ORAL_CAPSULE | Freq: Two times a day (BID) | ORAL | 0 refills | Status: AC

## 2022-10-29 NOTE — Addendum Note (Signed)
Addended by: Birdie Sons on: 10/29/2022 03:10 PM   Modules accepted: Orders

## 2022-10-29 NOTE — Telephone Encounter (Signed)
Patient is calling back to be advised.  Please view nurse triage notes 10/27/22.

## 2022-11-02 ENCOUNTER — Other Ambulatory Visit: Payer: Self-pay | Admitting: Family Medicine

## 2022-11-02 NOTE — Telephone Encounter (Signed)
Requested medication (s) are due for refill today: yes  Requested medication (s) are on the active medication list: yes  Last refill:  08/17/21 #90/4  Future visit scheduled: yes  Notes to clinic:  Unable to refill per protocol due to failed labs, no updated results.      Requested Prescriptions  Pending Prescriptions Disp Refills   amLODipine-benazepril (LOTREL) 5-20 MG capsule [Pharmacy Med Name: AMLODIPINE-BENAZEPRIL 5-20 MG] 90 capsule 4    Sig: TAKE 1 CAPSULE BY MOUTH EVERY DAY     Cardiovascular: CCB + ACEI Combos Failed - 11/02/2022 10:25 AM      Failed - Cr in normal range and within 180 days    Creatinine  Date Value Ref Range Status  12/11/2013 0.90 0.60 - 1.30 mg/dL Final   Creatinine, Ser  Date Value Ref Range Status  03/19/2022 1.15 (H) 0.57 - 1.00 mg/dL Final         Failed - K in normal range and within 180 days    Potassium  Date Value Ref Range Status  03/19/2022 4.8 3.5 - 5.2 mmol/L Final  12/11/2013 3.9 3.5 - 5.1 mmol/L Final         Failed - Na in normal range and within 180 days    Sodium  Date Value Ref Range Status  03/19/2022 138 134 - 144 mmol/L Final  12/11/2013 140 136 - 145 mmol/L Final         Failed - eGFR is 30 or above and within 180 days    EGFR (African American)  Date Value Ref Range Status  12/11/2013 >60  Final   GFR calc Af Amer  Date Value Ref Range Status  03/12/2020 76 >59 mL/min/1.73 Final    Comment:    **[facility] currently reports eGFR in compliance with the current**   recommendations of the [employer]. [facility] will   update reporting as new guidelines are published from the NKF-ASN   Task force.    EGFR (Non-African Amer.)  Date Value Ref Range Status  12/11/2013 >60  Final    Comment:    eGFR values <50m/min/1.73 m2 may be an indication of chronic kidney disease (CKD). Calculated eGFR is useful in patients with stable renal function. The eGFR calculation will not be reliable in acutely ill  patients when serum creatinine is changing rapidly. It is not useful in  patients on dialysis. The eGFR calculation may not be applicable to patients at the low and high extremes of body sizes, pregnant women, and vegetarians.    GFR calc non Af Amer  Date Value Ref Range Status  03/12/2020 66 >59 mL/min/1.73 Final   eGFR  Date Value Ref Range Status  03/19/2022 51 (L) >59 mL/min/1.73 Final         Failed - Valid encounter within last 6 months    Recent Outpatient Visits           1 week ago COVID-19 virus infection   [place]   7 [place]   1 year ago [facility]   2 years [facility] Benign essential HT[facility]   [facility]   2 years ago Upper respiratory tract infection, unspecified type   [facility]       Future Appointments  In 4 months Fisher, Kirstie Peri, MD [facility], [place] - Patient is not pregnant      Passed - Last BP in normal range    BP Readings from Last 1 Encounters:  10/26/22 127/85          spironolactone (ALDACTONE) 25 MG tablet [Pharmacy Med Name: SPIRONOLACTONE 25 MG TABLET] 90 tablet 4    Sig: TAKE 1 TABLET BY MOUTH EVERY DAY     Cardiovascular: Diuretics - Aldosterone Antagonist Failed - 11/02/2022 10:25 AM      Failed - Cr in normal range and within 180 days    Creatinine  Date Value Ref Range Status  12/11/2013 0.90 0.60 - 1.30 mg/dL Final   Creatinine, Ser  Date Value Ref Range Status  03/19/2022 1.15 (H) 0.57 - 1.00 mg/dL Final         Failed - K in normal range and within 180 days    Potassium  Date Value Ref Range Status  03/19/2022 4.8 3.5 - 5.2 mmol/L Final  12/11/2013 3.9 3.5 - 5.1 mmol/L Final         Failed - Na in normal range and within  180 days    Sodium  Date Value Ref Range Status  03/19/2022 138 134 - 144 mmol/L Final  12/11/2013 140 136 - 145 mmol/L Final         Failed - eGFR is 30 or above and within 180 days    EGFR (African American)  Date Value Ref Range Status  12/11/2013 >60  Final   GFR calc Af Amer  Date Value Ref Range Status  03/12/2020 76 >59 mL/min/1.73 Final    Comment:    **[facility] currently reports eGFR in compliance with the current**   recommendations of the [employer]. [facility] will   update reporting as new guidelines are published from the NKF-ASN   Task force.    EGFR (Non-African Amer.)  Date Value Ref Range Status  12/11/2013 >60  Final    Comment:    eGFR values <63m/min/1.73 m2 may be an indication of chronic kidney disease (CKD). Calculated eGFR is useful in patients with stable renal function. The eGFR calculation will not be reliable in acutely ill patients when serum creatinine is changing rapidly. It is not useful in  patients on dialysis. The eGFR calculation may not be applicable to patients at the low and high extremes of body sizes, pregnant women, and vegetarians.    GFR calc non Af Amer  Date Value Ref Range Status  03/12/2020 66 >59 mL/min/1.73 Final   eGFR  Date Value Ref Range Status  03/19/2022 51 (L) >59 mL/min/1.73 Final         Failed - Valid encounter within last 6 months    Recent Outpatient Visits           1 week ago COVID-19 virus infection   [place]   7 months ago [place]   1 year ago Annual physical exam   [facility]   2 years ago Benign essential HTN   [facility]   2 years ago [facility] respiratory tract infection, unspecified type   [facility]       Future Appointments  In 4 months  Fisher, Kirstie Peri, MD [facility]            Passed - Last BP in normal range    BP Readings from Last 1 Encounters:  10/26/22 127/85         Signed Prescriptions Disp Refills   lovastatin (MEVACOR) 20 MG tablet 90 tablet 1    Sig: TAKE 1 TABLET BY MOUTH EVERYDAY AT BEDTIME     Cardiovascular:  Antilipid - Statins 2 Failed - 11/02/2022 10:25 AM      Failed - Cr in normal range and within 360 days    Creatinine  Date Value Ref Range Status  12/11/2013 0.90 0.60 - 1.30 mg/dL Final   Creatinine, Ser  Date Value Ref Range Status  03/19/2022 1.15 (H) 0.57 - 1.00 mg/dL Final         Failed - Lipid Panel in normal range within the last 12 months    Cholesterol, Total  Date Value Ref Range Status  03/19/2022 182 100 - 199 mg/dL Final   LDL Chol Calc (NIH)  Date Value Ref Range Status  03/19/2022 109 (H) 0 - 99 mg/dL Final   HDL  Date Value Ref Range Status  03/19/2022 55 >39 mg/dL Final   Triglycerides  Date Value Ref Range Status  03/19/2022 101 0 - 149 mg/dL Final         Passed - Patient is not pregnant      Passed - Valid encounter within last 12 months    Recent Outpatient Visits           1 week ago COVID-19 virus infection   [employer], [place], MD   7 months ago [place], Donald E, MD   1 year ago Annual physical exam   [facility] Birdie Sons, MD   2 years ago Benign essential HTN   [facility] Birdie Sons, MD   2 years ago Upper respiratory tract infection, unspecified type   [facility] Birdie Sons, MD       Future Appointments             In 4 months Fisher, Kirstie Peri, MD [facility], [place]

## 2022-11-02 NOTE — Telephone Encounter (Signed)
Requested Prescriptions  Pending Prescriptions Disp Refills   lovastatin (MEVACOR) 20 MG tablet [Pharmacy Med Name: LOVASTATIN 20 MG TABLET] 90 tablet 4    Sig: TAKE 1 TABLET BY MOUTH EVERYDAY AT BEDTIME     Cardiovascular:  Antilipid - Statins 2 Failed - 11/02/2022 10:25 AM      Failed - Cr in normal range and within 360 days    Creatinine  Date Value Ref Range Status  12/11/2013 0.90 0.60 - 1.30 mg/dL Final   Creatinine, Ser  Date Value Ref Range Status  03/19/2022 1.15 (H) 0.57 - 1.00 mg/dL Final         Failed - Lipid Panel in normal range within the last 12 months    Cholesterol, Total  Date Value Ref Range Status  03/19/2022 182 100 - 199 mg/dL Final   LDL Chol Calc (NIH)  Date Value Ref Range Status  03/19/2022 109 (H) 0 - 99 mg/dL Final   HDL  Date Value Ref Range Status  03/19/2022 55 >39 mg/dL Final   Triglycerides  Date Value Ref Range Status  03/19/2022 101 0 - 149 mg/dL Final         Passed - Patient is not pregnant      Passed - Valid encounter within last 12 months    Recent Outpatient Visits           1 week ago COVID-19 virus infection   [facility] [place], [place]   7 months ago [place], Donald E, MD   1 year ago Annual physical exam   [facility] Birdie Sons, MD   2 years ago Benign essential HTN   [facility] Birdie Sons, MD   2 years ago Upper respiratory tract infection, unspecified type   [facility] Birdie Sons, MD       Future Appointments             In 4 months Fisher, Kirstie Peri, MD [facility]             amLODipine-benazepril (LOTREL) 5-20 MG capsule [Pharmacy Med Name: AMLODIPINE-BENAZEPRIL 5-20 MG] 90 capsule 4    Sig: TAKE 1 CAPSULE BY MOUTH EVERY DAY     Cardiovascular: CCB + ACEI Combos Failed - 11/02/2022 10:25 AM      Failed - Cr in normal range and within 180  days    Creatinine  Date Value Ref Range Status  12/11/2013 0.90 0.60 - 1.30 mg/dL Final   Creatinine, Ser  Date Value Ref Range Status  03/19/2022 1.15 (H) 0.57 - 1.00 mg/dL Final         Failed - K in normal range and within 180 days    Potassium  Date Value Ref Range Status  03/19/2022 4.8 3.5 - 5.2 mmol/L Final  12/11/2013 3.9 3.5 - 5.1 mmol/L Final         Failed - Na in normal range and within 180 days    Sodium  Date Value Ref Range Status  03/19/2022 138 134 - 144 mmol/L Final  12/11/2013 140 136 - 145 mmol/L Final         Failed - eGFR is 30 or above and within 180 days    EGFR (African American)  Date Value Ref Range Status  12/11/2013 >60  Final   GFR calc Af Amer  Date Value Ref Range Status  03/12/2020 76 >59 mL/min/1.73 Final  Comment:    **[facility] currently reports eGFR in compliance with the current**   recommendations of the [employer]. [facility] will   update reporting as new guidelines are published from the NKF-ASN   Task force.    EGFR (Non-African Amer.)  Date Value Ref Range Status  12/11/2013 >60  Final    Comment:    eGFR values <32m/min/1.73 m2 may be an indication of chronic kidney disease (CKD). Calculated eGFR is useful in patients with stable renal function. The eGFR calculation will not be reliable in acutely ill patients when serum creatinine is changing rapidly. It is not useful in  patients on dialysis. The eGFR calculation may not be applicable to patients at the low and high extremes of body sizes, pregnant women, and vegetarians.    GFR calc non Af Amer  Date Value Ref Range Status  03/12/2020 66 >59 mL/min/1.73 Final   eGFR  Date Value Ref Range Status  03/19/2022 51 (L) >59 mL/min/1.73 Final         Failed - Valid encounter within last 6 months    Recent Outpatient Visits           1 week ago COVID-19 virus infection   [facility] [place]   7 months ago  [place]   1 year ago Annual physical exam   [facility]   2 years ago [facility]ign essential HT[facility]   2 years ago Upper respiratory tract infection, unspecified type   [facility]       Future Appointments             In 4 months Fisher, [facility]irstie Peri [facility] is not [facility]      Passed - Last BP in normal range    BP Readings from Last 1 Encounters:  10/26/22 127/85          spironolactone (ALDACTONE) 25 MG tablet [Pharmacy Med Name: SPIRONOLACTONE 25 MG TABLET] 90 tablet 4    Sig: TAKE 1 TABLET BY MOUTH EVERY DAY     Cardiovascular: Diuretics - Aldosterone Antagonist Failed - 11/02/2022 10:25 AM      Failed - Cr in normal range and within 180 days    Creatinine  Date Value Ref Range Status  12/11/2013 0.90 0.60 - 1.30 mg/dL Final   Creatinine, Ser  Date Value Ref Range Status  03/19/2022 1.15 (H) 0.57 - 1.00 mg/dL Final         Failed - K in normal range and within 180 days    Potassium  Date Value Ref Range Status  03/19/2022 4.8 3.5 - 5.2 mmol/L Final  12/11/2013 3.9 3.5 - 5.1 mmol/L Final         Failed - Na in normal range and within 180 days    Sodium  Date Value Ref Range Status  03/19/2022 138 134 - 144 mmol/L Final  12/11/2013 140 136 - 145 mmol/L Final         Failed - eGFR is 30 or above and within 180 days    EGFR (African American)  Date Value Ref Range Status  12/11/2013 >60  Final   GFR calc Af Amer  Date Value Ref Range Status  03/12/2020 76 >59 mL/min/1.73  Final    Comment:    **[facility] currently reports eGFR in compliance with the current**   recommendations of the [employer]. [facility] will   update reporting as new guidelines are published from the NKF-ASN   Task force.    EGFR (Non-African  Amer.)  Date Value Ref Range Status  12/11/2013 >60  Final    Comment:    eGFR values <26m/min/1.73 m2 may be an indication of chronic kidney disease (CKD). Calculated eGFR is useful in patients with stable renal function. The eGFR calculation will not be reliable in acutely ill patients when serum creatinine is changing rapidly. It is not useful in  patients on dialysis. The eGFR calculation may not be applicable to patients at the low and high extremes of body sizes, pregnant women, and vegetarians.    GFR calc non Af Amer  Date Value Ref Range Status  03/12/2020 66 >59 mL/min/1.73 Final   eGFR  Date Value Ref Range Status  03/19/2022 51 (L) >59 mL/min/1.73 Final         Failed - Valid encounter within last 6 months    Recent Outpatient Visits           1 week ago COVID-19 virus infection   [facility] [place]   7 months ago [place]   1 year ago Annual physical exam   [facility]   2 years ago Benign essential HTN   [facility]   2 years ago Upper respiratory tract infection, unspecified type   [facility]       Future Appointments             In 4 months Fisher, [facility]Kirstie Peri [facility] in normal [place]    BP Readings from Last 1 Encounters:  10/26/22 127/85

## 2023-01-27 DIAGNOSIS — Z872 Personal history of diseases of the skin and subcutaneous tissue: Secondary | ICD-10-CM | POA: Diagnosis not present

## 2023-01-27 DIAGNOSIS — D485 Neoplasm of uncertain behavior of skin: Secondary | ICD-10-CM | POA: Diagnosis not present

## 2023-01-27 DIAGNOSIS — L578 Other skin changes due to chronic exposure to nonionizing radiation: Secondary | ICD-10-CM | POA: Diagnosis not present

## 2023-01-27 DIAGNOSIS — B372 Candidiasis of skin and nail: Secondary | ICD-10-CM | POA: Diagnosis not present

## 2023-03-22 NOTE — Progress Notes (Unsigned)
I,Sulibeya S Dimas,acting as a scribe for Mila Merry, MD.,have documented all relevant documentation on the behalf of Mila Merry, MD,as directed by  Mila Merry, MD while in the presence of Mila Merry, MD.    Complete Physical Exam      Patient: Dana Harper, Female    DOB: [birth year], 72 y.o.   MRN: [id number] Visit Date: 03/23/2023  Today's Provider: Mila Merry, MD   Chief Complaint  Patient presents with   Medicare Wellness   Subjective    Dana Harper is a 72 y.o. female who presents today for her complete physical examination. She reports consuming a general diet. Home exercise routine includes walking 3 hrs per week. She generally feels well. She reports sleeping well. She does not have additional problems to discuss today. She states she had check up with dermatologist about a month ago and has routine eye exam scheduled at the end of June.   She is also due to follow up on hypertension, lipids and vitamin D having increased vitamin D to 2000 units a day since last year  She also reports she continues to have trouble with skin getting irritated from moisture buildup in the skin folds.   She also has history of adenomatous polyps and is due for follow up colonoscopy.   Medications: Outpatient Medications Prior to Visit  Medication Sig   amLODipine-benazepril (LOTREL) 5-20 MG capsule TAKE 1 CAPSULE BY MOUTH EVERY DAY   aspirin 81 MG tablet Take 81 mg by mouth every other day.   Cholecalciferol (VITAMIN D) 50 MCG (2000 UT) tablet Take 2,000 Units by mouth daily.   Coenzyme Q10 (COQ10) 100 MG CAPS Take 1 capsule by mouth daily.   lovastatin (MEVACOR) 20 MG tablet TAKE 1 TABLET BY MOUTH EVERYDAY AT BEDTIME   metroNIDAZOLE (METROGEL) 1 % gel Apply topically daily.   spironolactone (ALDACTONE) 25 MG tablet TAKE 1 TABLET BY MOUTH EVERY DAY   [DISCONTINUED] benzonatate (TESSALON) 100 MG capsule Take 1 capsule (100 mg total) by mouth 2 (two) times daily  as needed for cough.   No facility-administered medications prior to visit.    Allergies  Allergen Reactions   Demerol  [Meperidine]    Codeine Rash    Patient Care Team: Malva Limes, MD as PCP - General (Family Medicine)  Review of Systems  Constitutional:  Negative for chills, diaphoresis and fever.  HENT:  Negative for congestion, ear discharge, ear pain, hearing loss, nosebleeds, sore throat and tinnitus.   Eyes:  Negative for photophobia, pain, discharge and redness.  Respiratory:  Negative for cough, shortness of breath, wheezing and stridor.   Cardiovascular:  Negative for chest pain, palpitations and leg swelling.  Gastrointestinal:  Negative for abdominal pain, blood in stool, constipation, diarrhea, nausea and vomiting.  Endocrine: Negative for polydipsia.  Genitourinary:  Negative for dysuria, flank pain, frequency, hematuria and urgency.  Musculoskeletal:  Negative for back pain, myalgias and neck pain.  Skin:  Positive for rash.  Allergic/Immunologic: Negative for environmental allergies.  Neurological:  Negative for dizziness, tremors, seizures, weakness and headaches.  Hematological:  Does not bruise/bleed easily.  Psychiatric/Behavioral:  Negative for hallucinations and suicidal ideas. The patient is not nervous/anxious.     Last CBC Lab Results  Component Value Date   WBC 8.7 03/19/2022   HGB 14.1 03/19/2022   HCT 40.7 03/19/2022   MCV 90 03/19/2022   MCH 31.3 03/19/2022   RDW 12.7 03/19/2022   PLT 279 03/19/2022   Last  metabolic panel Lab Results  Component Value Date   GLUCOSE 109 (H) 03/19/2022   NA 138 03/19/2022   K 4.8 03/19/2022   CL 102 03/19/2022   CO2 21 03/19/2022   BUN 19 03/19/2022   CREATININE 1.15 (H) 03/19/2022   EGFR 51 (L) 03/19/2022   CALCIUM 9.7 03/19/2022   PHOS 3.5 08/03/2019   PROT 7.2 03/19/2022   ALBUMIN 4.8 03/19/2022   LABGLOB 2.4 03/19/2022   AGRATIO 2.0 03/19/2022   BILITOT 1.1 03/19/2022   ALKPHOS 88  03/19/2022   AST 24 03/19/2022   ALT 19 03/19/2022   ANIONGAP 6 (L) 12/11/2013   Last lipids Lab Results  Component Value Date   CHOL 182 03/19/2022   HDL 55 03/19/2022   LDLCALC 109 (H) 03/19/2022   TRIG 101 03/19/2022   CHOLHDL 3.3 03/19/2022    Last thyroid functions Lab Results  Component Value Date   TSH 0.998 03/12/2020   Last vitamin D Lab Results  Component Value Date   VD25OH 37.6 03/19/2022   Last vitamin B12 and Folate Lab Results  Component Value Date   VITAMINB12 297 03/12/2020        Objective    Vitals: BP 119/60 (BP Location: Right Arm, Patient Position: Sitting, Cuff Size: Large)   Pulse 85   Temp 98.2 F (36.8 C) (Temporal)   Resp 12   Ht 5\' 2"  (1.575 m)   Wt 170 lb 8 oz (77.3 kg)   SpO2 100%   BMI 31.18 kg/m  BP Readings from Last 3 Encounters:  03/23/23 119/60  10/26/22 127/85  03/19/22 134/67   Wt Readings from Last 3 Encounters:  03/23/23 170 lb 8 oz (77.3 kg)  10/26/22 165 lb 12.8 oz (75.2 kg)  03/19/22 165 lb (74.8 kg)    Physical Exam  General Appearance:    Mildly obese female. Alert, cooperative, in no acute distress, appears stated age   Head:    Normocephalic, without obvious abnormality, atraumatic  Eyes:    PERRL, conjunctiva/corneas clear, EOM's intact, fundi    benign, both eyes  Ears:    Normal TM's and external ear canals, both ears  Nose:   Nares normal, septum midline, mucosa normal, no drainage    or sinus tenderness  Throat:   Lips, mucosa, and tongue normal; teeth and gums normal  Neck:   Supple, symmetrical, trachea midline, no adenopathy;    thyroid:  no enlargement/tenderness/nodules; no carotid   bruit or JVD  Back:     Symmetric, no curvature, ROM normal, no CVA tenderness  Lungs:     Clear to auscultation bilaterally, respirations unlabored  Chest Wall:    No tenderness or deformity   Heart:    Normal heart rate. Normal rhythm. No murmurs, rubs, or gallops.   Breast Exam:    deferred  Abdomen:      Soft, non-tender, bowel sounds active all four quadrants,    no masses, no organomegaly  Pelvic:    deferred  Extremities:   All extremities are intact. No cyanosis or edema  Pulses:   2+ and symmetric all extremities  Skin:   Skin color, texture, turgor normal, no rashes or lesions  Lymph nodes:   Cervical, supraclavicular, and axillary nodes normal  Neurologic:   CNII-XII intact, normal strength, sensation and reflexes    throughout      Assessment & Plan     1. Annual physical exam   2. Encounter for screening mammogram for malignant neoplasm of  breast  - MM 3D SCREENING MAMMOGRAM BILATERAL BREAST  3. History of adenomatous polyp of colon   4. Colon cancer screening  - Ambulatory referral to Gastroenterology  5. Primary hypertension Well controlled.  Continue current medications.   - CBC - TSH  6. Vitamin D deficiency  - VITAMIN D 25 Hydroxy (Vit-D Deficiency, Fractures)  7. Hypercholesteremia She is tolerating lovastatin well with no adverse effects.   - Comprehensive metabolic panel - Lipid panel  8. [facility] Recommend OTC barrier creams on prn antifungals as per patient instructions.       The entirety of the information documented in the History of Present Illness, Review of Systems and Physical Exam were personally obtained by me. Portions of this information were initially documented by the CMA and reviewed by me for thoroughness and accuracy.     Mila Merry, MD  [facility] [phone] (phone) [phone] (fax)  [facility]

## 2023-03-23 ENCOUNTER — Encounter: Payer: Self-pay | Admitting: Family Medicine

## 2023-03-23 ENCOUNTER — Ambulatory Visit (INDEPENDENT_AMBULATORY_CARE_PROVIDER_SITE_OTHER): Payer: Medicare PPO | Admitting: Family Medicine

## 2023-03-23 VITALS — BP 119/60 | HR 85 | Temp 98.2°F | Resp 12 | Ht 62.0 in | Wt 170.5 lb

## 2023-03-23 DIAGNOSIS — I10 Essential (primary) hypertension: Secondary | ICD-10-CM

## 2023-03-23 DIAGNOSIS — Z1231 Encounter for screening mammogram for malignant neoplasm of breast: Secondary | ICD-10-CM

## 2023-03-23 DIAGNOSIS — E7800 Pure hypercholesterolemia, unspecified: Secondary | ICD-10-CM

## 2023-03-23 DIAGNOSIS — Z0000 Encounter for general adult medical examination without abnormal findings: Secondary | ICD-10-CM

## 2023-03-23 DIAGNOSIS — E559 Vitamin D deficiency, unspecified: Secondary | ICD-10-CM | POA: Diagnosis not present

## 2023-03-23 DIAGNOSIS — Z1211 Encounter for screening for malignant neoplasm of colon: Secondary | ICD-10-CM

## 2023-03-23 DIAGNOSIS — L304 Erythema intertrigo: Secondary | ICD-10-CM

## 2023-03-23 DIAGNOSIS — Z86010 Personal history of colonic polyps: Secondary | ICD-10-CM

## 2023-03-23 NOTE — Progress Notes (Signed)
Annual Wellness Visit     Patient: Dana Harper, Female    DOB: 1951-07-08, 72 y.o.   MRN: [id number] Visit Date: 03/23/2023  Today's Provider: Mila Merry, MD   Chief Complaint  Patient presents with   Medicare Wellness   Subjective    Dana Harper is a 72 y.o. female who presents today for her Annual Wellness Visit.  Medications: Outpatient Medications Prior to Visit  Medication Sig   amLODipine-benazepril (LOTREL) 5-20 MG capsule TAKE 1 CAPSULE BY MOUTH EVERY DAY   aspirin 81 MG tablet Take 81 mg by mouth every other day.   Cholecalciferol (VITAMIN D) 50 MCG (2000 UT) tablet Take 2,000 Units by mouth daily.   Coenzyme Q10 (COQ10) 100 MG CAPS Take 1 capsule by mouth daily.   lovastatin (MEVACOR) 20 MG tablet TAKE 1 TABLET BY MOUTH EVERYDAY AT BEDTIME   metroNIDAZOLE (METROGEL) 1 % gel Apply topically daily.   spironolactone (ALDACTONE) 25 MG tablet TAKE 1 TABLET BY MOUTH EVERY DAY   [DISCONTINUED] benzonatate (TESSALON) 100 MG capsule Take 1 capsule (100 mg total) by mouth 2 (two) times daily as needed for cough.   No facility-administered medications prior to visit.    Allergies  Allergen Reactions   Demerol  [Meperidine]    Codeine Rash    Patient Care Team: Malva Limes, MD as PCP - General (Family Medicine) Dermatology Optometry       Objective     Most recent functional status assessment:    03/23/2023    8:45 AM  In your present state of health, do you have any difficulty performing the following activities:  Hearing? 0  Vision? 0  Difficulty concentrating or making decisions? 0  Walking or climbing stairs? 0  Dressing or bathing? 0  Doing errands, shopping? 0   Most recent fall risk assessment:    03/23/2023    8:45 AM  Fall Risk   Falls in the past year? 0  Number falls in past yr: 0  Injury with Fall? 0  Risk for fall due to : No Fall Risks  Follow up Falls evaluation completed    Most recent depression  screenings:    03/23/2023    8:45 AM 10/26/2022    2:57 PM  PHQ 2/9 Scores  PHQ - 2 Score 0 0  PHQ- 9 Score 0 0   Most recent cognitive screening:    03/18/2022    3:02 PM  6CIT Screen  What Year? 0 points  What month? 0 points  What time? 0 points  Count back from 20 0 points  Months in reverse 0 points  Repeat phrase 0 points  Total Score 0 points   Most recent Audit-C alcohol use screening    03/23/2023    8:45 AM  Alcohol Use Disorder Test (AUDIT)  1. How often do you have a drink containing alcohol? 0  2. How many drinks containing alcohol do you have on a typical day when you are drinking? 0  3. How often do you have six or more drinks on one occasion? 0  AUDIT-C Score 0   A score of 3 or more in women, and 4 or more in men indicates increased risk for alcohol abuse, EXCEPT if all of the points are from question 1   No results found for any visits on 03/23/23.  Assessment & Plan     Annual wellness visit done today including the all of the following: Reviewed patient's  Family Medical History Reviewed and updated list of patient's medical providers Assessment of cognitive impairment was done Assessed patient's functional ability Established a written schedule for health screening services Health Risk Assessent Completed and Reviewed  Exercise Activities and Dietary recommendations  Goals      DIET - EAT MORE FRUITS AND VEGETABLES     Weight (lb) < 140 lb (63.5 kg)     Starting 12/17/16, I will work to lose 25 pounds this year.   12/20/17, recommend to continue diet and exercise to aid in weight loss goal.          Immunization History  Administered Date(s) Administered   Fluad Quad(high Dose 65+) 08/03/2019, 08/06/2020, 08/19/2021, 09/10/2022   Influenza Split 10/22/2010   Influenza, High Dose Seasonal PF 09/05/2018   Influenza,inj,Quad PF,6+ Mos 08/14/2013, 10/30/2015   Influenza-Unspecified 08/13/2016, 08/12/2017   Moderna Sars-Covid-2 Vaccination  11/20/2019, 12/18/2019   Pneumococcal Conjugate-13 12/20/2017   Pneumococcal Polysaccharide-23 12/21/2018   Tdap 05/29/2008   Zoster, Live 07/10/2014    Health Maintenance  Topic Date Due   Zoster Vaccines- Shingrix (1 of 2) Never done   DTaP/Tdap/Td (2 - Td or Tdap) 05/29/2018   COVID-19 Vaccine (3 - 2023-24 season) 06/25/2022   Colonoscopy  12/01/2022   MAMMOGRAM  04/16/2023   INFLUENZA VACCINE  05/26/2023   Medicare Annual Wellness (AWV)  03/22/2024   DEXA SCAN  05/20/2025   Pneumonia Vaccine 33+ Years old  Completed   Hepatitis C Screening  Completed   HPV VACCINES  Aged Out     Discussed health benefits of physical activity, and encouraged her to engage in regular exercise appropriate for her age and condition.         Mila Merry, MD  [facility] [phone] (phone) [phone] (fax)  [facility]

## 2023-03-23 NOTE — Patient Instructions (Addendum)
The CDC recommends two doses of Shingrix (the shingles vaccine) separated by 2 to 6 months for adults age 72 years and older. I recommend checking with your insurance plan regarding coverage for this vaccine.   You are due for a Tdap (tetanus-diptheria-pertussis vaccine) which protects you from tetanus and whooping cough. Please check with your insurance plan or pharmacy regarding coverage for this vaccine.    Apply Zinc Oxide (Dessitin) or A&D ointment daily to protect skin from moisture. Apply Lamisil cream or spray as needed if skin becomes sore or irritated.

## 2023-03-24 LAB — COMPREHENSIVE METABOLIC PANEL
ALT: 22 IU/L (ref 0–32)
AST: 21 IU/L (ref 0–40)
Albumin/Globulin Ratio: 1.8 (ref 1.2–2.2)
Albumin: 4.5 g/dL (ref 3.8–4.8)
Alkaline Phosphatase: 93 IU/L (ref 44–121)
BUN/Creatinine Ratio: 21 (ref 12–28)
BUN: 22 mg/dL (ref 8–27)
Bilirubin Total: 1 mg/dL (ref 0.0–1.2)
CO2: 21 mmol/L (ref 20–29)
Calcium: 9.5 mg/dL (ref 8.7–10.3)
Chloride: 105 mmol/L (ref 96–106)
Creatinine, Ser: 1.04 mg/dL — ABNORMAL HIGH (ref 0.57–1.00)
Globulin, Total: 2.5 g/dL (ref 1.5–4.5)
Glucose: 108 mg/dL — ABNORMAL HIGH (ref 70–99)
Potassium: 4.4 mmol/L (ref 3.5–5.2)
Sodium: 141 mmol/L (ref 134–144)
Total Protein: 7 g/dL (ref 6.0–8.5)
eGFR: 57 mL/min/{1.73_m2} — ABNORMAL LOW (ref >59)

## 2023-03-24 LAB — CBC
Hematocrit: 41 % (ref 34.0–46.6)
Hemoglobin: 13.8 g/dL (ref 11.1–15.9)
MCH: 31.6 pg (ref 26.6–33.0)
MCHC: 33.7 g/dL (ref 31.5–35.7)
MCV: 94 fL (ref 79–97)
Platelets: 237 10*3/uL (ref 150–450)
RBC: 4.37 x10E6/uL (ref 3.77–5.28)
RDW: 12.4 % (ref 11.7–15.4)
WBC: 8.4 10*3/uL (ref 3.4–10.8)

## 2023-03-24 LAB — VITAMIN D 25 HYDROXY (VIT D DEFICIENCY, FRACTURES): Vit D, 25-Hydroxy: 37.9 ng/mL (ref 30.0–100.0)

## 2023-03-24 LAB — LIPID PANEL
Chol/HDL Ratio: 3.3 ratio (ref 0.0–4.4)
Cholesterol, Total: 170 mg/dL (ref 100–199)
HDL: 51 mg/dL (ref >39)
LDL Chol Calc (NIH): 101 mg/dL — ABNORMAL HIGH (ref 0–99)
Triglycerides: 101 mg/dL (ref 0–149)
VLDL Cholesterol Cal: 18 mg/dL (ref 5–40)

## 2023-03-24 LAB — TSH: TSH: 0.917 u[IU]/mL (ref 0.450–4.500)

## 2023-03-29 ENCOUNTER — Encounter: Payer: Self-pay | Admitting: *Deleted

## 2023-05-14 ENCOUNTER — Other Ambulatory Visit: Payer: Self-pay | Admitting: Family Medicine

## 2023-06-08 ENCOUNTER — Ambulatory Visit
Admission: RE | Admit: 2023-06-08 | Discharge: 2023-06-08 | Disposition: A | Discharge status: Home / Self Care | Payer: Medicare PPO | Source: Ambulatory Visit | Attending: Family Medicine | Admitting: Family Medicine

## 2023-06-08 DIAGNOSIS — Z1231 Encounter for screening mammogram for malignant neoplasm of breast: Secondary | ICD-10-CM | POA: Insufficient documentation

## 2023-08-10 ENCOUNTER — Ambulatory Visit (INDEPENDENT_AMBULATORY_CARE_PROVIDER_SITE_OTHER): Payer: Medicare PPO

## 2023-08-10 DIAGNOSIS — Z23 Encounter for immunization: Secondary | ICD-10-CM

## 2023-08-17 ENCOUNTER — Ambulatory Visit: Payer: Medicare PPO

## 2023-11-12 ENCOUNTER — Other Ambulatory Visit: Payer: Self-pay | Admitting: Family Medicine

## 2023-11-13 ENCOUNTER — Other Ambulatory Visit: Payer: Self-pay | Admitting: Family Medicine

## 2024-03-23 ENCOUNTER — Encounter: Payer: Self-pay | Admitting: Family Medicine

## 2024-03-23 ENCOUNTER — Ambulatory Visit: Payer: Self-pay | Admitting: Family Medicine

## 2024-03-23 VITALS — BP 130/74 | HR 90 | Resp 16 | Ht 63.0 in | Wt 174.8 lb

## 2024-03-23 DIAGNOSIS — Z1211 Encounter for screening for malignant neoplasm of colon: Secondary | ICD-10-CM

## 2024-03-23 DIAGNOSIS — Z800 Family history of malignant neoplasm of digestive organs: Secondary | ICD-10-CM

## 2024-03-23 DIAGNOSIS — I2510 Atherosclerotic heart disease of native coronary artery without angina pectoris: Secondary | ICD-10-CM

## 2024-03-23 DIAGNOSIS — L723 Sebaceous cyst: Secondary | ICD-10-CM

## 2024-03-23 DIAGNOSIS — E7800 Pure hypercholesterolemia, unspecified: Secondary | ICD-10-CM | POA: Diagnosis not present

## 2024-03-23 DIAGNOSIS — I10 Essential (primary) hypertension: Secondary | ICD-10-CM | POA: Diagnosis not present

## 2024-03-23 DIAGNOSIS — E559 Vitamin D deficiency, unspecified: Secondary | ICD-10-CM | POA: Diagnosis not present

## 2024-03-23 DIAGNOSIS — L304 Erythema intertrigo: Secondary | ICD-10-CM | POA: Insufficient documentation

## 2024-03-23 DIAGNOSIS — L989 Disorder of the skin and subcutaneous tissue, unspecified: Secondary | ICD-10-CM

## 2024-03-23 DIAGNOSIS — M8589 Other specified disorders of bone density and structure, multiple sites: Secondary | ICD-10-CM

## 2024-03-23 DIAGNOSIS — Z0001 Encounter for general adult medical examination with abnormal findings: Secondary | ICD-10-CM

## 2024-03-23 DIAGNOSIS — Z860101 Personal history of adenomatous and serrated colon polyps: Secondary | ICD-10-CM

## 2024-03-23 DIAGNOSIS — Z0000 Encounter for general adult medical examination without abnormal findings: Secondary | ICD-10-CM

## 2024-03-23 MED ORDER — NYSTATIN 100000 UNIT/GM EX CREA
1.0000 | TOPICAL_CREAM | Freq: Two times a day (BID) | CUTANEOUS | 1 refills | Status: AC
Start: 2024-03-23 — End: ?

## 2024-03-23 NOTE — Patient Instructions (Addendum)
 You are due for a Tdap (tetanus-diptheria-pertussis vaccine) which protects you from tetanus and whooping cough. Please check with your insurance plan or pharmacy regarding coverage for this vaccine.   The CDC recommends two doses of Shingrix  (the shingles vaccine) separated by 2 to 6 months for adults age 73 years and older. I recommend checking with your insurance plan regarding coverage for this vaccine.   Have your dermatologist check out the lesion on the bridge of your nose, which is probably an actinic keratosis, but could be a basal cell carcinoma  Call [facility] at [phone] to schedule your follow up colonosocpy

## 2024-03-23 NOTE — Progress Notes (Signed)
 Complete physical exam   Patient: Dana Harper   DOB: [birth year]   73 y.o. Female  MRN: [id number] Visit Date: 03/23/2024  Today's healthcare provider: Jeralene Mom, MD   Chief Complaint  Patient presents with   Medicare Wellness   Annual Exam   Subjective     Discussed the use of AI scribe software for clinical note transcription with the patient, who gave verbal consent to proceed.  History of Present Illness   Dana Harper "Dana Harper" is a 73 year old female who presents for an annual physical exam.  She has a concern about a 'spot' on the bridge of her nose that she noticed a few months ago, as well as sebaceous cyst on the left side of her neck which she has had for many years. she typically sees Dr. Cathryn Cobb.   She uses Metrogel  for rosacea, applying it as needed before makeup. She experiences irritation in the skin folds, particularly under the belly line and at the back of her buttocks, which sometimes becomes red and has an odor. She uses A and D ointment as a barrier cream and is considering an antifungal cream for severe irritation.  She monitors her blood pressure at home, which aligns with the office readings. She is due for a colonoscopy and plans to coordinate this with her husband. She is also due for a tetanus shot and has not yet received Shingrix   She has an eye appointment scheduled for next month and a dentist appointment next week due to a broken tooth. She plans to schedule her mammogram in August, as she typically does annually.  No stomach problems, cramping, or changes in bowel habits. No lumps, sores, or unusual pains in her breasts.  No chest pain or dyspnea.    Lab Results  Component Value Date   CHOL 170 03/23/2023   HDL 51 03/23/2023   LDLCALC 101 (H) 03/23/2023   TRIG 101 03/23/2023   CHOLHDL 3.3 03/23/2023   Lab Results  Component Value Date   NA 141 03/23/2023   K 4.4 03/23/2023   CREATININE 1.04 (H) 03/23/2023   EGFR  57 (L) 03/23/2023   GLUCOSE 108 (H) 03/23/2023     Past Medical History:  Diagnosis Date   History of pancreatitis    Lichen sclerosus    Past Surgical History:  Procedure Laterality Date   ABDOMINAL HYSTERECTOMY  10/25/1977   vaginal: partial  hysterectomy. No history of cancerous or precancerous conditions   BREAST BIOPSY Left    benign   COLONOSCOPY WITH PROPOFOL  N/A 12/02/2015   Procedure: COLONOSCOPY WITH PROPOFOL ;  Surgeon: Marnee Sink, MD;  Location: [facility] ENDOSCOPY;  Service: Endoscopy;  Laterality: N/A;   mammogram screening  08/20/2013   Bi rads cat 4   pap smear  10/26/2007   Dr. Ramiro Burly -Rachell Budge   stress echocardiogram  08/31/2013   changes consistent with apical ischemia   Social History   Socioeconomic History   Marital status: Married    Spouse name: Not on file   Number of children: 2   Years of education: Not on file   Highest education level: Some college, no degree  Occupational History   Occupation: Retired  Tobacco Use   Smoking status: Never   Smokeless tobacco: Never  Vaping Use   Vaping status: Never Used  Substance and Sexual Activity   Alcohol use: No   Drug use: No   Sexual activity: Not on file  Other Topics Concern  Not on file  Social History Narrative   Not on file   Social Drivers of Health   Financial Resource Strain: Low Risk  (03/23/2024)   Overall Financial Resource Strain (CARDIA)    Difficulty of Paying Living Expenses: Not hard at all  Food Insecurity: No Food Insecurity (03/23/2024)   Hunger Vital Sign    Worried About Running Out of Food in the Last Year: Never true    Ran Out of Food in the Last Year: Never true  Transportation Needs: No Transportation Needs (03/23/2024)   PRAPARE - Administrator, Civil Service (Medical): No    Lack of Transportation (Non-Medical): No  Physical Activity: Sufficiently Active (03/23/2024)   Exercise Vital Sign    Days of Exercise per Week: 4 days    Minutes of Exercise per  Session: 40 min  Stress: No Stress Concern Present (03/23/2024)   [employer] of Occupational Health - Occupational Stress Questionnaire    Feeling of Stress : Not at all  Social Connections: Moderately Integrated (03/23/2024)   Social Connection and Isolation Panel [NHANES]    Frequency of Communication with Friends and Family: More than three times a week    Frequency of Social Gatherings with Friends and Family: More than three times a week    Attends Religious Services: More than 4 times per year    Active Member of [employer] or Organizations: No    Attends Banker Meetings: Never    Marital Status: Married  Catering manager Violence: Not At Risk (03/23/2024)   Humiliation, Afraid, Rape, and Kick questionnaire    Fear of Current or Ex-Partner: No    Emotionally Abused: No    Physically Abused: No    Sexually Abused: No   Family Status  Relation Name Status   Mother  Deceased at age 69       Sepsis   Father  Deceased at age 65   Sister  Alive  No partnership data on file   Family History  Problem Relation Age of Onset   Cancer Father        stomach cancer   Breast cancer Sister 79   Cancer Sister        breast cancer   Colon cancer Sister    Allergies  Allergen Reactions   Demerol  [Meperidine]    Codeine Rash    Patient Care Team: Lamon Pillow, MD as PCP - General (Family Medicine)   Medications: Outpatient Medications Prior to Visit  Medication Sig   amLODipine -benazepril  (LOTREL) 5-20 MG capsule TAKE 1 CAPSULE BY MOUTH EVERY DAY   aspirin 81 MG tablet Take 81 mg by mouth every other day.   Cholecalciferol (VITAMIN D ) 50 MCG (2000 UT) tablet Take 2,000 Units by mouth daily.   Coenzyme Q10 (COQ10) 100 MG CAPS Take 1 capsule by mouth daily.   lovastatin  (MEVACOR ) 20 MG tablet TAKE 1 TABLET BY MOUTH EVERYDAY AT BEDTIME   metroNIDAZOLE  (METROGEL ) 1 % gel Apply topically daily.   spironolactone  (ALDACTONE ) 25 MG tablet TAKE 1 TABLET BY MOUTH EVERY  DAY   No facility-administered medications prior to visit.    Review of Systems  Constitutional:  Negative for appetite change, chills, fatigue and fever.  Respiratory:  Negative for chest tightness and shortness of breath.   Cardiovascular:  Negative for chest pain and palpitations.  Gastrointestinal:  Negative for abdominal pain, nausea and vomiting.  Neurological:  Negative for dizziness and weakness.  Objective    BP 130/74 (BP Location: Left Arm, Patient Position: Sitting, Cuff Size: Normal)   Pulse 90   Resp 16   Ht 5\' 3"  (1.6 m)   Wt 174 lb 12.8 oz (79.3 kg)   SpO2 98%   BMI 30.96 kg/m    Physical Exam   General Appearance:    Overweight female. Alert, cooperative, in no acute distress, appears stated age   Head:    Normocephalic, without obvious abnormality, atraumatic  Eyes:    PERRL, conjunctiva/corneas clear, EOM's intact, fundi    benign, both eyes  Ears:    Normal TM's and external ear canals, both ears  Nose:   Nares normal, septum midline, mucosa normal, no drainage    or sinus tenderness. About 7.98mm scaly slightly raised lesion on bridge of nose covered with make up.   Throat:   Lips, mucosa, and tongue normal; teeth and gums normal  Neck:   Supple, symmetrical, trachea midline, no adenopathy;    thyroid :  no enlargement/tenderness/nodules; no carotid   bruit or JVD. Grape size non-tender, non-inflamed sebaceous cyst left mid neck.   Back:     Symmetric, no curvature, ROM normal, no CVA tenderness  Lungs:     Clear to auscultation bilaterally, respirations unlabored  Chest Wall:    No tenderness or deformity   Heart:    Normal heart rate. Normal rhythm. No murmurs, rubs, or gallops.   Breast Exam:    deferred  Abdomen:     Soft, non-tender, bowel sounds active all four quadrants,    no masses, no organomegaly  Pelvic:    deferred  Extremities:   All extremities are intact. No cyanosis or edema  Pulses:   2+ and symmetric all extremities  Skin:    Skin color, texture, turgor normal, no rashes or lesions  Lymph nodes:   Cervical, supraclavicular, and axillary nodes normal  Neurologic:   CNII-XII intact, normal strength, sensation and reflexes    throughout     Assessment & Plan    Routine Health Maintenance and Physical Exam  Exercise Activities and Dietary recommendations  Goals      DIET - EAT MORE FRUITS AND VEGETABLES     Weight (lb) < 140 lb (63.5 kg)     Starting 12/17/16, I will work to lose 25 pounds this year.   12/20/17, recommend to continue diet and exercise to aid in weight loss goal.          Immunization History  Administered Date(s) Administered   Fluad Quad(high Dose 65+) 08/03/2019, 08/06/2020, 08/19/2021, 09/10/2022   Fluad Trivalent(High Dose 65+) 08/10/2023   Influenza Split 10/22/2010   Influenza, High Dose Seasonal PF 09/05/2018   Influenza,inj,Quad PF,6+ Mos 08/14/2013, 10/30/2015   Influenza-Unspecified 08/13/2016, 08/12/2017   Moderna Sars-Covid-2 Vaccination 11/20/2019, 12/18/2019   Pneumococcal Conjugate-13 12/20/2017   Pneumococcal Polysaccharide-23 12/21/2018   Tdap 05/29/2008   Zoster, Live 07/10/2014    Health Maintenance  Topic Date Due   Zoster Vaccines- Shingrix  (1 of 2) 06/08/2001   DTaP/Tdap/Td (2 - Td or Tdap) 05/29/2018   Colonoscopy  12/01/2022   COVID-19 Vaccine (3 - 2024-25 season) 06/26/2023   Medicare Annual Wellness (AWV)  03/22/2024   INFLUENZA VACCINE  05/25/2024   MAMMOGRAM  06/07/2024   DEXA SCAN  05/20/2025   Pneumonia Vaccine 73+ Years old  Completed   Hepatitis C Screening  Completed   HPV VACCINES  Aged Out   Meningococcal B Vaccine  Aged Out  Discussed health benefits of physical activity, and encouraged her to engage in regular exercise appropriate for her age and condition.   2. Family history of cancer of digestive system   3. History of adenomatous polyp of colon Last colonoscopy 2017  4. Colon cancer screening  - Ambulatory referral to  Gastroenterology  5. Vitamin D  deficiency  - VITAMIN D  25 Hydroxy (Vit-D Deficiency, Fractures)  6. Primary hypertension Well controlled.  Continue current medications.   - Comprehensive metabolic panel with GFR  7. Hypercholesteremia She is tolerating lovastatin  well with no adverse effects.   - CBC - Lipid panel  8. Coronary artery disease involving native heart without angina pectoris, unspecified vessel or lesion type   9. Osteopenia of multiple sites Dexa in 2026  10. Intertrigo  - nystatin cream (MYCOSTATIN); Apply 1 Application topically 2 (two) times daily.  Dispense: 30 g; Refill: 1   11. Skin lesion on bridge of nose suspicious for AK or BCC and sebaceous cyst side of neck  - Advised needs to follow up with her dermatologist sometime over the next few months.   Return in about 1 year (around 03/23/2025) for Yearly Physical.        Jeralene Mom, MD  [facility] [phone] (phone) [phone] (fax)  [facility]

## 2024-03-24 LAB — CBC
Hematocrit: 43.6 % (ref 34.0–46.6)
Hemoglobin: 14.3 g/dL (ref 11.1–15.9)
MCH: 31.4 pg (ref 26.6–33.0)
MCHC: 32.8 g/dL (ref 31.5–35.7)
MCV: 96 fL (ref 79–97)
Platelets: 271 10*3/uL (ref 150–450)
RBC: 4.55 x10E6/uL (ref 3.77–5.28)
RDW: 12.5 % (ref 11.7–15.4)
WBC: 9.3 10*3/uL (ref 3.4–10.8)

## 2024-03-24 LAB — LIPID PANEL
Chol/HDL Ratio: 3.9 ratio (ref 0.0–4.4)
Cholesterol, Total: 180 mg/dL (ref 100–199)
HDL: 46 mg/dL (ref >39)
LDL Chol Calc (NIH): 107 mg/dL — ABNORMAL HIGH (ref 0–99)
Triglycerides: 155 mg/dL — ABNORMAL HIGH (ref 0–149)
VLDL Cholesterol Cal: 27 mg/dL (ref 5–40)

## 2024-03-24 LAB — COMPREHENSIVE METABOLIC PANEL WITH GFR
ALT: 23 IU/L (ref 0–32)
AST: 25 IU/L (ref 0–40)
Albumin: 4.7 g/dL (ref 3.8–4.8)
Alkaline Phosphatase: 98 IU/L (ref 44–121)
BUN/Creatinine Ratio: 19 (ref 12–28)
BUN: 20 mg/dL (ref 8–27)
Bilirubin Total: 1.2 mg/dL (ref 0.0–1.2)
CO2: 20 mmol/L (ref 20–29)
Calcium: 9.6 mg/dL (ref 8.7–10.3)
Chloride: 105 mmol/L (ref 96–106)
Creatinine, Ser: 1.03 mg/dL — ABNORMAL HIGH (ref 0.57–1.00)
Globulin, Total: 2.5 g/dL (ref 1.5–4.5)
Glucose: 106 mg/dL — ABNORMAL HIGH (ref 70–99)
Potassium: 4.6 mmol/L (ref 3.5–5.2)
Sodium: 140 mmol/L (ref 134–144)
Total Protein: 7.2 g/dL (ref 6.0–8.5)
eGFR: 58 mL/min/{1.73_m2} — ABNORMAL LOW (ref >59)

## 2024-03-24 LAB — VITAMIN D 25 HYDROXY (VIT D DEFICIENCY, FRACTURES): Vit D, 25-Hydroxy: 37.8 ng/mL (ref 30.0–100.0)

## 2024-03-25 ENCOUNTER — Ambulatory Visit: Payer: Self-pay | Admitting: Family Medicine

## 2024-04-13 ENCOUNTER — Other Ambulatory Visit: Payer: Self-pay

## 2024-04-13 DIAGNOSIS — Z1211 Encounter for screening for malignant neoplasm of colon: Secondary | ICD-10-CM

## 2024-04-19 ENCOUNTER — Telehealth: Payer: Self-pay

## 2024-04-19 ENCOUNTER — Other Ambulatory Visit: Payer: Self-pay

## 2024-04-19 DIAGNOSIS — Z860100 Personal history of colon polyps, unspecified: Secondary | ICD-10-CM

## 2024-04-19 MED ORDER — PEG 3350-KCL-NA BICARB-NACL 420 G PO SOLR: 4000.0000 mL | Freq: Once | ORAL | 0 refills | Status: AC

## 2024-04-19 NOTE — Telephone Encounter (Signed)
 Gastroenterology Pre-Procedure Review  Request Date: 07/12/24 Requesting Physician: Dr. jinny  PATIENT REVIEW QUESTIONS: The patient responded to the following health history questions as indicated:    1. Are you having any GI issues? no 2. Do you have a personal history of Polyps? yes (last colonoscopy performed by dr jinny 2017 recommended repeat in 5 yrs) 3. Do you have a family history of Colon Cancer or Polyps? no 4. Diabetes Mellitus? no 5. Joint replacements in the past 12 months?no 6. Major health problems in the past 3 months?no 7. Any artificial heart valves, MVP, or defibrillator?no    MEDICATIONS & ALLERGIES:    Patient reports the following regarding taking any anticoagulation/antiplatelet therapy:   Plavix, Coumadin, Eliquis, Xarelto, Lovenox, Pradaxa, Brilinta, or Effient? no Aspirin? no  Patient confirms/reports the following medications:  Current Outpatient Medications  Medication Sig Dispense Refill   amLODipine -benazepril  (LOTREL) 5-20 MG capsule TAKE 1 CAPSULE BY MOUTH EVERY DAY 90 capsule 4   aspirin 81 MG tablet Take 81 mg by mouth every other day.     Cholecalciferol (VITAMIN D ) 50 MCG (2000 UT) tablet Take 2,000 Units by mouth daily.     Coenzyme Q10 (COQ10) 100 MG CAPS Take 1 capsule by mouth daily.     lovastatin  (MEVACOR ) 20 MG tablet TAKE 1 TABLET BY MOUTH EVERYDAY AT BEDTIME 90 tablet 4   metroNIDAZOLE  (METROGEL ) 1 % gel Apply topically daily. 45 g 1   nystatin  cream (MYCOSTATIN ) Apply 1 Application topically 2 (two) times daily. 30 g 1   spironolactone  (ALDACTONE ) 25 MG tablet TAKE 1 TABLET BY MOUTH EVERY DAY 90 tablet 4   No current facility-administered medications for this visit.    Patient confirms/reports the following allergies:  Allergies  Allergen Reactions   Demerol  [Meperidine]    Codeine Rash    No orders of the defined types were placed in this encounter.   AUTHORIZATION INFORMATION Primary Insurance: 1D#: Group #:  Secondary  Insurance: 1D#: Group #:  SCHEDULE INFORMATION: Date: 07/12/24 Time: Location: [facility]

## 2024-06-15 DIAGNOSIS — M79621 Pain in right upper arm: Secondary | ICD-10-CM | POA: Diagnosis not present

## 2024-07-04 ENCOUNTER — Other Ambulatory Visit: Payer: Self-pay

## 2024-07-04 MED ORDER — PEG 3350-KCL-NA BICARB-NACL 420 G PO SOLR: ORAL | 0 refills | Status: AC

## 2024-07-09 ENCOUNTER — Telehealth: Payer: Self-pay

## 2024-07-09 ENCOUNTER — Encounter: Payer: Self-pay | Admitting: Family Medicine

## 2024-07-09 ENCOUNTER — Ambulatory Visit: Admitting: Family Medicine

## 2024-07-09 VITALS — BP 138/82 | HR 96 | Resp 16 | Ht 63.0 in | Wt 174.0 lb

## 2024-07-09 DIAGNOSIS — L089 Local infection of the skin and subcutaneous tissue, unspecified: Secondary | ICD-10-CM | POA: Diagnosis not present

## 2024-07-09 DIAGNOSIS — L723 Sebaceous cyst: Secondary | ICD-10-CM

## 2024-07-09 MED ORDER — DOXYCYCLINE HYCLATE 100 MG PO TABS: 100.0000 mg | ORAL_TABLET | Freq: Two times a day (BID) | ORAL | 0 refills | Status: DC

## 2024-07-09 NOTE — Telephone Encounter (Signed)
 Pt contacted office to ask if it would be okay for her to take her antibiotics she was prescribed for a cyst she had removed from her neck.  She said Dr. Gasper advised her to take it twice a day with food.  Advised that she could take her antibiotics as directed. However during her clear liquid diet make sure she is drinking plenty of liquids to help alleviate any nausea with the antibiotics and prep.  On the morning of procedure advised her to take her antibiotic after her procedure and the second one later on that evening before bed.    She agreed with recommendations.  Asked her to call back if she has any other questions.  Thanks,  [place], CMA

## 2024-07-09 NOTE — Progress Notes (Signed)
 Established patient visit   Patient: Dana Harper   DOB: 03/07/1951   73 y.o. Female  MRN: [id number] Visit Date: 07/09/2024  Today's healthcare provider: Nancyann Perry, MD   Chief Complaint  Patient presents with   Acute Visit    cyst on neck thats swollen pt she has been with for a while and the several's weeks has gotten worse   Subjective    Discussed the use of AI scribe software for clinical note transcription with the patient, who gave verbal consent to proceed.  History of Present Illness   Dana Harper is a 73 year old female who presents with a draining cyst on her neck.  She has a cyst on her neck that has been draining since Saturday. The drainage was first noticed during a wedding due to an odor and the sensation of fluid. Her hair adhered to the area, leading her to keep her hair pulled up. The cyst is associated with tenderness and pressure. No fevers, chills, or sweats are present.  She is preparing for a colonoscopy scheduled for Thursday and has started a low fiber diet in preparation. She is concerned about the impact of the cyst and its treatment on the procedure.  She has previously taken Augmentin, which caused gastrointestinal upset, and is unsure if she has taken doxycycline  before. She takes blood pressure medication and ensures she eats something with it.       Medications: Outpatient Medications Prior to Visit  Medication Sig   amLODipine -benazepril  (LOTREL) 5-20 MG capsule TAKE 1 CAPSULE BY MOUTH EVERY DAY   aspirin 81 MG tablet Take 81 mg by mouth every other day.   Cholecalciferol (VITAMIN D ) 50 MCG (2000 UT) tablet Take 2,000 Units by mouth daily.   Coenzyme Q10 (COQ10) 100 MG CAPS Take 1 capsule by mouth daily.   lovastatin  (MEVACOR ) 20 MG tablet TAKE 1 TABLET BY MOUTH EVERYDAY AT BEDTIME   metroNIDAZOLE  (METROGEL ) 1 % gel Apply topically daily.   nystatin  cream (MYCOSTATIN ) Apply 1 Application topically 2 (two) times  daily.   polyethylene glycol-electrolytes (NULYTELY) 420 g solution Prepare according to package instructions. Starting at 5:00 PM: Drink one 8 oz glass of mixture every 15 minutes until you finish half of the jug. Five hours prior to procedure, drink 8 oz glass of mixture every 15 minutes until it is all gone. Make sure you do not drink anything 4 hours prior to your procedure.   spironolactone  (ALDACTONE ) 25 MG tablet TAKE 1 TABLET BY MOUTH EVERY DAY   No facility-administered medications prior to visit.   Review of Systems  Constitutional:  Negative for appetite change, chills, fatigue and fever.  Respiratory:  Negative for chest tightness and shortness of breath.   Cardiovascular:  Negative for chest pain and palpitations.  Gastrointestinal:  Negative for abdominal pain, nausea and vomiting.  Neurological:  Negative for dizziness and weakness.       Objective    BP 138/82 (BP Location: Right Arm, Patient Position: Sitting, Cuff Size: Normal)   Pulse 96   Resp 16   Ht 5' 3 (1.6 m)   Wt 174 lb (78.9 kg)   SpO2 100%   BMI 30.82 kg/m   Physical Exam   About 1.5cm cystic slightly erythematous mass left side of neck draining small amount purulent material.    Assessment & Plan       Infected epidermal cyst of neck Infected epidermal cyst on the neck, draining  slowly. Doxycycline  chosen due to previous GI side effects with Augmentin. Needle aspiration performed. - Prescribed doxycycline  twice daily for two weeks. - Advised warm compress to aid drainage. - Instructed to apply and change bandage daily. - Instructed to inform colonoscopy team about antibiotic use.  Follow-Up Discussed follow-up for cyst and upcoming procedures. Scheduled for colonoscopy and mammogram. Plans for flu shot in October. - Instructed to call Doctor Wohl's office about antibiotic use.         Nancyann Perry, MD  [facility] [phone] (phone) [phone] (fax)  [facility]

## 2024-07-12 ENCOUNTER — Ambulatory Visit

## 2024-07-12 ENCOUNTER — Ambulatory Visit
Admission: RE | Admit: 2024-07-12 | Discharge: 2024-07-12 | Disposition: A | Discharge status: Home / Self Care | Attending: Gastroenterology | Admitting: Gastroenterology

## 2024-07-12 ENCOUNTER — Other Ambulatory Visit: Payer: Self-pay | Admitting: Family Medicine

## 2024-07-12 ENCOUNTER — Encounter: Payer: Self-pay | Admitting: Gastroenterology

## 2024-07-12 ENCOUNTER — Encounter
Admission: RE | Disposition: A | Discharge status: Home / Self Care | Payer: Self-pay | Source: Home / Self Care | Attending: Gastroenterology

## 2024-07-12 DIAGNOSIS — Z860100 Personal history of colon polyps, unspecified: Secondary | ICD-10-CM | POA: Diagnosis not present

## 2024-07-12 DIAGNOSIS — Z1231 Encounter for screening mammogram for malignant neoplasm of breast: Secondary | ICD-10-CM

## 2024-07-12 DIAGNOSIS — K5730 Diverticulosis of large intestine without perforation or abscess without bleeding: Secondary | ICD-10-CM | POA: Diagnosis not present

## 2024-07-12 DIAGNOSIS — Z09 Encounter for follow-up examination after completed treatment for conditions other than malignant neoplasm: Secondary | ICD-10-CM | POA: Diagnosis not present

## 2024-07-12 DIAGNOSIS — D123 Benign neoplasm of transverse colon: Secondary | ICD-10-CM | POA: Insufficient documentation

## 2024-07-12 DIAGNOSIS — I10 Essential (primary) hypertension: Secondary | ICD-10-CM | POA: Diagnosis not present

## 2024-07-12 DIAGNOSIS — I2510 Atherosclerotic heart disease of native coronary artery without angina pectoris: Secondary | ICD-10-CM | POA: Insufficient documentation

## 2024-07-12 DIAGNOSIS — Z1211 Encounter for screening for malignant neoplasm of colon: Secondary | ICD-10-CM | POA: Insufficient documentation

## 2024-07-12 DIAGNOSIS — K640 First degree hemorrhoids: Secondary | ICD-10-CM | POA: Insufficient documentation

## 2024-07-12 DIAGNOSIS — K635 Polyp of colon: Secondary | ICD-10-CM

## 2024-07-12 HISTORY — PX: POLYPECTOMY: SHX149

## 2024-07-12 HISTORY — PX: COLONOSCOPY: SHX5424

## 2024-07-12 SURGERY — COLONOSCOPY
Anesthesia: General

## 2024-07-12 MED ORDER — PROPOFOL 500 MG/50ML IV EMUL
INTRAVENOUS | Status: DC | PRN
  Administered 2024-07-12: 150 ug/kg/min via INTRAVENOUS

## 2024-07-12 MED ORDER — PROPOFOL 10 MG/ML IV BOLUS
INTRAVENOUS | Status: DC | PRN
  Administered 2024-07-12: 100 mg via INTRAVENOUS
  Administered 2024-07-12: 50 mg via INTRAVENOUS

## 2024-07-12 MED ORDER — SODIUM CHLORIDE 0.9 % IV SOLN
INTRAVENOUS | Status: DC
  Administered 2024-07-12: 500 mL via INTRAVENOUS

## 2024-07-12 NOTE — Op Note (Signed)
 [facility] Gastroenterology Patient Name: Dana Harper Procedure Date: 07/12/2024 9:27 AM MRN: [id number] Account #: [id number] Date of Birth: January 19, 1951 Admit Type: Outpatient Age: 73 Room: [facility] ENDO ROOM 4 Gender: Female Note Status: Finalized Instrument Name: Colon Scope [id number] Procedure:             Colonoscopy Indications:           High risk colon cancer surveillance: Personal history                         of colonic polyps Providers:             Rogelia Copping MD, MD Referring MD:          Nancyann BRAVO. Gasper, MD (Referring MD) Medicines:             Propofol  per Anesthesia Complications:         No immediate complications. Procedure:             Pre-Anesthesia Assessment:                        - Prior to the procedure, a History and Physical was                         performed, and patient medications and allergies were                         reviewed. The patient's tolerance of previous                         anesthesia was also reviewed. The risks and benefits                         of the procedure and the sedation options and risks                         were discussed with the patient. All questions were                         answered, and informed consent was obtained. Prior                         Anticoagulants: The patient has taken no anticoagulant                         or antiplatelet agents. ASA Grade Assessment: II - A                         patient with mild systemic disease. After reviewing                         the risks and benefits, the patient was deemed in                         satisfactory condition to undergo the procedure.                        After obtaining informed consent, the colonoscope was  passed under direct vision. Throughout the procedure,                         the patient's blood pressure, pulse, and oxygen                         saturations were monitored continuously. The                          Colonoscope was introduced through the anus and                         advanced to the the cecum, identified by appendiceal                         orifice and ileocecal valve. The colonoscopy was                         performed without difficulty. The patient tolerated                         the procedure well. The quality of the bowel                         preparation was excellent. Findings:      The perianal and digital rectal examinations were normal.      A 5 mm polyp was found in the transverse colon. The polyp was sessile.       The polyp was removed with a cold snare. Resection and retrieval were       complete.      Non-bleeding internal hemorrhoids were found during retroflexion. The       hemorrhoids were Grade I (internal hemorrhoids that do not prolapse).      Many small-mouthed diverticula were found in the sigmoid colon. Impression:            - One 5 mm polyp in the transverse colon, removed with                         a cold snare. Resected and retrieved.                        - Non-bleeding internal hemorrhoids.                        - Diverticulosis in the sigmoid colon. Recommendation:        - Discharge patient to home.                        - Resume previous diet.                        - Continue present medications.                        - Await pathology results.                        - Repeat colonoscopy in 7 years for surveillance if  doing well and health. Procedure Code(s):     --- Professional ---                        [phone], Colonoscopy, flexible; with removal of                         tumor(s), polyp(s), or other lesion(s) by snare                         technique Diagnosis Code(s):     --- Professional ---                        Z86.010, Personal history of colonic polyps                        D12.3, Benign neoplasm of transverse colon (hepatic                         flexure or splenic  flexure) CPT copyright 2022 American Medical Association. All rights reserved. The codes documented in this report are preliminary and upon coder review may  be revised to meet current compliance requirements. Rogelia Copping MD, MD 07/12/2024 9:47:26 AM This report has been signed electronically. Number of Addenda: 0 Note Initiated On: 07/12/2024 9:27 AM Scope Withdrawal Time: 0 hours 6 minutes 56 seconds  Total Procedure Duration: 0 hours 9 minutes 27 seconds  Estimated Blood Loss:  Estimated blood loss: none.      [facility]

## 2024-07-12 NOTE — Anesthesia Postprocedure Evaluation (Signed)
 Anesthesia Post Note  Patient: Dana Harper  Procedure(s) Performed: COLONOSCOPY POLYPECTOMY, INTESTINE  Patient location during evaluation: PACU Anesthesia Type: General Level of consciousness: awake Pain management: satisfactory to patient Vital Signs Assessment: post-procedure vital signs reviewed and stable Respiratory status: spontaneous breathing Cardiovascular status: blood pressure returned to baseline Anesthetic complications: no   No notable events documented.   Last Vitals:  Vitals:   07/12/24 0959 07/12/24 1009  BP: 117/83 121/80  Pulse: (!) 102 90  Resp: (!) 21 15  Temp:    SpO2: 98% 99%    Last Pain:  Vitals:   07/12/24 1009  TempSrc:   PainSc: 0-No pain                 VAN STAVEREN,Marquinn Meschke

## 2024-07-12 NOTE — Transfer of Care (Signed)
 Immediate Anesthesia Transfer of Care Note  Patient: Dana Harper  Procedure(s) Performed: COLONOSCOPY POLYPECTOMY, INTESTINE  Patient Location: Endoscopy Unit  Anesthesia Type:General  Level of Consciousness: sedated and drowsy  Airway & Oxygen Therapy: Patient Spontanous Breathing  Post-op Assessment: Report given to RN and Post -op Vital signs reviewed and stable  Post vital signs: Reviewed  Last Vitals:  Vitals Value Taken Time  BP 100/71 0948  Temp    Pulse 114 07/12/24 09:48  Resp 31 07/12/24 09:48  SpO2 95 % 07/12/24 09:48  Vitals shown include unfiled device data.  Last Pain:  Vitals:   07/12/24 0842  TempSrc: Temporal  PainSc: 0-No pain         Complications: No notable events documented.

## 2024-07-12 NOTE — Anesthesia Preprocedure Evaluation (Signed)
 Anesthesia Evaluation  Patient identified by MRN, date of birth, ID band Patient awake    Reviewed: Allergy & Precautions, NPO status , Patient's Chart, lab work & pertinent test results  Airway Mallampati: II  TM Distance: >3 FB Neck ROM: Full    Dental  (+) Teeth Intact, Caps,    Pulmonary neg pulmonary ROS   Pulmonary exam normal breath sounds clear to auscultation       Cardiovascular Exercise Tolerance: Good hypertension, + CAD  negative cardio ROS Normal cardiovascular exam     Neuro/Psych negative neurological ROS  negative psych ROS   GI/Hepatic negative GI ROS, Neg liver ROS,,,  Endo/Other  negative endocrine ROS    Renal/GU negative Renal ROS  negative genitourinary   Musculoskeletal   Abdominal Normal abdominal exam  (+)   Peds negative pediatric ROS (+)  Hematology negative hematology ROS (+)   Anesthesia Other Findings Past Medical History: No date: History of pancreatitis No date: Lichen sclerosus  Past Surgical History: 10/25/1977: ABDOMINAL HYSTERECTOMY     Comment:  vaginal: partial  hysterectomy. No history of cancerous               or precancerous conditions No date: BREAST BIOPSY; Left     Comment:  benign 12/02/2015: COLONOSCOPY WITH PROPOFOL ; N/A     Comment:  Procedure: COLONOSCOPY WITH PROPOFOL ;  Surgeon: Rogelia Copping, MD;  Location: [facility] END[facility];  Service: Endoscopy;              Laterality: N/A; 08/20/2013: mammogram screening     Comment:  Bi rads cat 4 10/26/2007: pap smear     Comment:  Dr. Verla -Gleen 08/31/2013: stress echocardiogram     Comment:  changes consistent with apical ischemia  BMI    Body Mass Index: 30.15 kg/m      Reproductive/Obstetrics negative OB ROS                              Anesthesia Physical Anesthesia Plan  ASA: 2  Anesthesia Plan: General   Post-op Pain Management:    Induction:  Intravenous  PONV Risk Score and Plan: Propofol  infusion and TIVA  Airway Management Planned: Natural Airway and Nasal Cannula  Additional Equipment:   Intra-op Plan:   Post-operative Plan:   Informed Consent: I have reviewed the patients History and Physical, chart, labs and discussed the procedure including the risks, benefits and alternatives for the proposed anesthesia with the patient or authorized representative who has indicated his/her understanding and acceptance.     Dental Advisory Given  Plan Discussed with: CRNA  Anesthesia Plan Comments:         Anesthesia Quick Evaluation

## 2024-07-12 NOTE — H&P (Signed)
 Dana Copping, MD [facility] [address] [place], [place] [address] Phone:[phone] Fax : [phone]  Primary Care Physician:  Dana Dana BRAVO, MD Primary Gastroenterologist:  Dr. Copping  Pre-Procedure History & Physical: HPI:  Dana Harper is a 73 y.o. female is here for an colonoscopy.   Past Medical History:  Diagnosis Date   History of pancreatitis    Lichen sclerosus     Past Surgical History:  Procedure Laterality Date   ABDOMINAL HYSTERECTOMY  10/25/1977   vaginal: partial  hysterectomy. No history of cancerous or precancerous conditions   BREAST BIOPSY Left    benign   COLONOSCOPY WITH PROPOFOL  N/A 12/02/2015   Procedure: COLONOSCOPY WITH PROPOFOL ;  Surgeon: Dana Copping, MD;  Location: [facility] ENDOSCOPY;  Service: Endoscopy;  Laterality: N/A;   mammogram screening  08/20/2013   Bi rads cat 4   pap smear  10/26/2007   Dr. Verla -Gleen   stress echocardiogram  08/31/2013   changes consistent with apical ischemia    Prior to Admission medications   Medication Sig Start Date End Date Taking? Authorizing Provider  amLODipine -benazepril  (LOTREL) 5-20 MG capsule TAKE 1 CAPSULE BY MOUTH EVERY DAY 11/14/23  Yes Dana Dana BRAVO, MD  aspirin 81 MG tablet Take 81 mg by mouth every other day.   Yes [provider]  Cholecalciferol (VITAMIN D ) 50 MCG (2000 UT) tablet Take 2,000 Units by mouth daily.   Yes [provider]  Coenzyme Q10 (COQ10) 100 MG CAPS Take 1 capsule by mouth daily.   Yes [provider]  doxycycline  (VIBRA -TABS) 100 MG tablet Take 1 tablet (100 mg total) by mouth 2 (two) times daily for 10 days. 07/09/24 07/19/24 Yes Dana Dana BRAVO, MD  lovastatin  (MEVACOR ) 20 MG tablet TAKE 1 TABLET BY MOUTH EVERYDAY Dana Harper 11/13/23  Yes Dana Dana BRAVO, MD  metroNIDAZOLE  (METROGEL ) 1 % gel Apply topically daily. 03/19/22  Yes Dana Dana BRAVO, MD  polyethylene glycol-electrolytes (NULYTELY) 420 g solution Prepare according to  package instructions. Starting Dana 5:00 PM: Drink one 8 oz glass of mixture every 15 minutes until you finish half of the jug. Five hours prior to procedure, drink 8 oz glass of mixture every 15 minutes until it is all gone. Make sure you do not drink anything 4 hours prior to your procedure. 07/04/24  Yes Harper Rogelia, MD  spironolactone  (ALDACTONE ) 25 MG tablet TAKE 1 TABLET BY MOUTH EVERY DAY 11/14/23  Yes Dana Dana BRAVO, MD  nystatin  cream (MYCOSTATIN ) Apply 1 Application topically 2 (two) times daily. 03/23/24   Dana Dana BRAVO, MD    Allergies as of 04/19/2024 - Review Complete 03/23/2024  Allergen Reaction Noted   Demerol  [meperidine]  10/29/2015   Codeine Rash 09/10/2013    Family History  Problem Relation Age of Onset   Cancer Father        stomach cancer   Breast cancer Sister 91   Cancer Sister        breast cancer   Colon cancer Sister     Social History   Socioeconomic History   Marital status: Married    Spouse name: Not on file   Number of children: 2   Years of education: Not on file   Highest education level: Some college, no degree  Occupational History   Occupation: Retired  Tobacco Use   Smoking status: Never   Smokeless tobacco: Never  Vaping Use   Vaping status: Never Used  Substance and Sexual Activity  Alcohol use: No   Drug use: No   Sexual activity: Not on file  Other Topics Concern   Not on file  Social History Narrative   Not on file   Social Drivers of Health   Financial Resource Strain: Low Risk  (03/23/2024)   Overall Financial Resource Strain (CARDIA)    Difficulty of Paying Living Expenses: Not hard Dana all  Food Insecurity: No Food Insecurity (03/23/2024)   Hunger Vital Sign    Worried About Running Out of Food in the Last Year: Never true    Ran Out of Food in the Last Year: Never true  Transportation Needs: No Transportation Needs (03/23/2024)   PRAPARE - Administrator, Civil Service (Medical): No    Lack of  Transportation (Non-Medical): No  Physical Activity: Sufficiently Active (03/23/2024)   Exercise Vital Sign    Days of Exercise per Week: 4 days    Minutes of Exercise per Session: 40 min  Stress: No Stress Concern Present (03/23/2024)   [employer] of Occupational Health - Occupational Stress Questionnaire    Feeling of Stress : Not Dana all  Social Connections: Moderately Integrated (03/23/2024)   Social Connection and Isolation Panel    Frequency of Communication with Friends and Family: More than three times a week    Frequency of Social Gatherings with Friends and Family: More than three times a week    Attends Religious Services: More than 4 times per year    Active Member of [employer] or Organizations: No    Attends Banker Meetings: Never    Marital Status: Married  Catering manager Violence: Not Dana Risk (03/23/2024)   Humiliation, Afraid, Rape, and Kick questionnaire    Fear of Current or Ex-Partner: No    Emotionally Abused: No    Physically Abused: No    Sexually Abused: No    Review of Systems: See HPI, otherwise negative ROS  Physical Exam: BP (!) 150/84   Pulse 93   Temp (!) 96.6 F (35.9 C) (Temporal)   Resp 18   Ht 5' 3 (1.6 m)   Wt 77.2 kg   SpO2 100%   BMI 30.15 kg/m  General:   Alert,  pleasant and cooperative in NAD Head:  Normocephalic and atraumatic. Neck:  Supple; no masses or thyromegaly. Lungs:  Clear throughout to auscultation.    Heart:  Regular rate and rhythm. Abdomen:  Soft, nontender and nondistended. Normal bowel sounds, without guarding, and without rebound.   Neurologic:  Alert and  oriented x4;  grossly normal neurologically.  Impression/Plan: Dana Harper is here for an colonoscopy to be performed for history of colon polyps  Risks, benefits, limitations, and alternatives regarding  colonoscopy have been reviewed with the patient.  Questions have been answered.  All parties agreeable.   Dana Copping, MD  07/12/2024,  9:01 AM

## 2024-07-13 LAB — SURGICAL PATHOLOGY

## 2024-07-16 ENCOUNTER — Ambulatory Visit: Payer: Self-pay | Admitting: Gastroenterology

## 2024-07-18 ENCOUNTER — Encounter: Payer: Self-pay | Admitting: Family Medicine

## 2024-07-18 ENCOUNTER — Ambulatory Visit: Admitting: Family Medicine

## 2024-07-18 VITALS — BP 134/65 | HR 88 | Resp 14 | Ht 63.0 in | Wt 172.9 lb

## 2024-07-18 DIAGNOSIS — L723 Sebaceous cyst: Secondary | ICD-10-CM

## 2024-07-18 DIAGNOSIS — L089 Local infection of the skin and subcutaneous tissue, unspecified: Secondary | ICD-10-CM | POA: Diagnosis not present

## 2024-07-18 MED ORDER — DOXYCYCLINE HYCLATE 100 MG PO TABS: 100.0000 mg | ORAL_TABLET | Freq: Two times a day (BID) | ORAL | 0 refills | Status: AC

## 2024-07-18 NOTE — Progress Notes (Signed)
      Established patient visit   Patient: Dana Harper   DOB: 1951/01/10   73 y.o. Female  MRN: [id number] Visit Date: 07/18/2024  Today's healthcare provider: Nancyann Perry, MD   Chief Complaint  Patient presents with   Follow-up    Cyst f/u; still draining, but has improved a little bit.   Subjective    HPI One week follow up infected sebaceous cyst left side of neck. Aspirated and started on doxycycline  at visit one week ago. Tolerating antibiotic without adverse effects. Feels much better, swelling is mostly gone, still draining, no fever.   Medications: Outpatient Medications Prior to Visit  Medication Sig   amLODipine -benazepril  (LOTREL) 5-20 MG capsule TAKE 1 CAPSULE BY MOUTH EVERY DAY   aspirin 81 MG tablet Take 81 mg by mouth every other day.   Cholecalciferol (VITAMIN D ) 50 MCG (2000 UT) tablet Take 2,000 Units by mouth daily.   Coenzyme Q10 (COQ10) 100 MG CAPS Take 1 capsule by mouth daily.   lovastatin  (MEVACOR ) 20 MG tablet TAKE 1 TABLET BY MOUTH EVERYDAY AT BEDTIME   metroNIDAZOLE  (METROGEL ) 1 % gel Apply topically daily.   nystatin  cream (MYCOSTATIN ) Apply 1 Application topically 2 (two) times daily.   polyethylene glycol-electrolytes (NULYTELY) 420 g solution Prepare according to package instructions. Starting at 5:00 PM: Drink one 8 oz glass of mixture every 15 minutes until you finish half of the jug. Five hours prior to procedure, drink 8 oz glass of mixture every 15 minutes until it is all gone. Make sure you do not drink anything 4 hours prior to your procedure.   spironolactone  (ALDACTONE ) 25 MG tablet TAKE 1 TABLET BY MOUTH EVERY DAY   [DISCONTINUED] doxycycline  (VIBRA -TABS) 100 MG tablet Take 1 tablet (100 mg total) by mouth 2 (two) times daily for 10 days.   No facility-administered medications prior to visit.    Review of Systems  Respiratory: Negative.  Negative for cough, shortness of breath and wheezing.   Cardiovascular:  Negative for chest  pain, palpitations and leg swelling.  Neurological:  Negative for weakness and headaches.       Objective    BP 134/65   Pulse 88   Resp 14   Ht 5' 3 (1.6 m)   Wt 172 lb 14.4 oz (78.4 kg)   SpO2 100%   BMI 30.63 kg/m    Physical Exam  Cystic mass has resolved since visit last month. Small amount of mucopurulent discharge. Is not tender. About 1-27mm erythema around drainage.   Assessment & Plan     1. Infected sebaceous cyst (Primary) Much improved since started treatment one week ago, not quite resolved.   Add additional 7 days doxycycline  (VIBRA -TABS) 100 MG tablet twice a day  Continue warm compresses and daily dressing changes until drainage stops.   Call if symptoms change or if not rapidly improving.           Nancyann Perry, MD  [facility] [phone] (phone) [phone] (fax)  [facility]

## 2024-07-26 ENCOUNTER — Ambulatory Visit
Admission: RE | Admit: 2024-07-26 | Discharge: 2024-07-26 | Disposition: A | Discharge status: Home / Self Care | Source: Ambulatory Visit | Attending: Family Medicine | Admitting: Family Medicine

## 2024-07-26 DIAGNOSIS — Z1231 Encounter for screening mammogram for malignant neoplasm of breast: Secondary | ICD-10-CM | POA: Diagnosis not present

## 2024-07-30 ENCOUNTER — Other Ambulatory Visit: Payer: Self-pay | Admitting: Family Medicine

## 2024-07-30 DIAGNOSIS — R928 Other abnormal and inconclusive findings on diagnostic imaging of breast: Secondary | ICD-10-CM

## 2024-08-01 ENCOUNTER — Encounter

## 2024-08-01 ENCOUNTER — Ambulatory Visit
Admission: RE | Admit: 2024-08-01 | Discharge: 2024-08-01 | Disposition: A | Discharge status: Home / Self Care | Source: Ambulatory Visit | Attending: Family Medicine | Admitting: Family Medicine

## 2024-08-01 DIAGNOSIS — R928 Other abnormal and inconclusive findings on diagnostic imaging of breast: Secondary | ICD-10-CM | POA: Diagnosis not present

## 2024-08-01 DIAGNOSIS — R92333 Mammographic heterogeneous density, bilateral breasts: Secondary | ICD-10-CM | POA: Diagnosis not present

## 2024-08-21 ENCOUNTER — Ambulatory Visit (INDEPENDENT_AMBULATORY_CARE_PROVIDER_SITE_OTHER)

## 2024-08-21 DIAGNOSIS — Z23 Encounter for immunization: Secondary | ICD-10-CM

## 2024-09-21 ENCOUNTER — Telehealth: Payer: Self-pay

## 2025-03-27 ENCOUNTER — Encounter: Admitting: Family Medicine
